# Patient Record
Sex: Female | Born: 1980
Health system: Southern US, Community
[De-identification: ages and names within clinical notes are randomized; demographics above are authoritative.]

## PROBLEM LIST (undated history)

## (undated) DIAGNOSIS — K648 Other hemorrhoids: Secondary | ICD-10-CM

## (undated) DIAGNOSIS — D62 Acute posthemorrhagic anemia: Secondary | ICD-10-CM

## (undated) DIAGNOSIS — Z8619 Personal history of other infectious and parasitic diseases: Secondary | ICD-10-CM

## (undated) DIAGNOSIS — J0100 Acute maxillary sinusitis, unspecified: Secondary | ICD-10-CM

## (undated) HISTORY — DX: Personal history of other infectious and parasitic diseases: Z86.19

## (undated) HISTORY — DX: Other hemorrhoids: K64.8

## (undated) HISTORY — DX: Acute maxillary sinusitis, unspecified: J01.00

---

## 2002-06-21 HISTORY — PX: WISDOM TOOTH EXTRACTION: SHX21

## 2004-07-28 ENCOUNTER — Ambulatory Visit: Payer: Self-pay | Admitting: Family Medicine

## 2004-08-18 ENCOUNTER — Ambulatory Visit: Payer: Self-pay | Admitting: Internal Medicine

## 2004-09-30 ENCOUNTER — Ambulatory Visit: Payer: Self-pay | Admitting: Family Medicine

## 2004-10-22 ENCOUNTER — Ambulatory Visit: Payer: Self-pay | Admitting: Family Medicine

## 2005-03-22 ENCOUNTER — Encounter: Payer: Self-pay | Admitting: Family Medicine

## 2005-03-22 ENCOUNTER — Other Ambulatory Visit: Admission: RE | Admit: 2005-03-22 | Discharge: 2005-03-22 | Payer: Self-pay | Admitting: Family Medicine

## 2005-03-22 ENCOUNTER — Ambulatory Visit: Payer: Self-pay | Admitting: Family Medicine

## 2005-05-31 ENCOUNTER — Ambulatory Visit: Payer: Self-pay | Admitting: Family Medicine

## 2005-12-13 ENCOUNTER — Ambulatory Visit: Payer: Self-pay | Admitting: Family Medicine

## 2006-12-01 ENCOUNTER — Ambulatory Visit: Payer: Self-pay | Admitting: Internal Medicine

## 2006-12-01 DIAGNOSIS — K120 Recurrent oral aphthae: Secondary | ICD-10-CM | POA: Insufficient documentation

## 2006-12-01 LAB — CONVERTED CEMR LAB: Rapid Strep: NEGATIVE

## 2006-12-19 ENCOUNTER — Ambulatory Visit: Payer: Self-pay | Admitting: Internal Medicine

## 2006-12-19 ENCOUNTER — Encounter: Payer: Self-pay | Admitting: Family Medicine

## 2006-12-19 LAB — CONVERTED CEMR LAB
Nitrite: NEGATIVE
Specific Gravity, Urine: 1.01
WBC Urine, dipstick: NEGATIVE

## 2007-01-28 ENCOUNTER — Encounter: Payer: Self-pay | Admitting: Family Medicine

## 2007-01-28 ENCOUNTER — Ambulatory Visit: Payer: Self-pay | Admitting: Family Medicine

## 2007-05-08 ENCOUNTER — Encounter: Payer: Self-pay | Admitting: Family Medicine

## 2007-05-08 ENCOUNTER — Other Ambulatory Visit: Admission: RE | Admit: 2007-05-08 | Discharge: 2007-05-08 | Payer: Self-pay | Admitting: Family Medicine

## 2007-05-08 ENCOUNTER — Ambulatory Visit: Payer: Self-pay | Admitting: Family Medicine

## 2007-05-08 DIAGNOSIS — B009 Herpesviral infection, unspecified: Secondary | ICD-10-CM | POA: Insufficient documentation

## 2007-05-10 ENCOUNTER — Encounter (INDEPENDENT_AMBULATORY_CARE_PROVIDER_SITE_OTHER): Payer: Self-pay | Admitting: *Deleted

## 2007-05-11 ENCOUNTER — Telehealth (INDEPENDENT_AMBULATORY_CARE_PROVIDER_SITE_OTHER): Payer: Self-pay | Admitting: *Deleted

## 2007-05-12 ENCOUNTER — Ambulatory Visit: Payer: Self-pay | Admitting: Family Medicine

## 2007-05-12 ENCOUNTER — Encounter (INDEPENDENT_AMBULATORY_CARE_PROVIDER_SITE_OTHER): Payer: Self-pay | Admitting: *Deleted

## 2007-05-12 DIAGNOSIS — R1013 Epigastric pain: Secondary | ICD-10-CM | POA: Insufficient documentation

## 2007-05-17 ENCOUNTER — Ambulatory Visit: Payer: Self-pay | Admitting: Family Medicine

## 2007-05-24 LAB — CONVERTED CEMR LAB
ALT: 21 units/L (ref 0–35)
AST: 22 units/L (ref 0–37)
Basophils Relative: 0.2 % (ref 0.0–1.0)
Bilirubin, Direct: 0.1 mg/dL (ref 0.0–0.3)
CO2: 30 meq/L (ref 19–32)
Calcium: 9.4 mg/dL (ref 8.4–10.5)
Creatinine, Ser: 0.7 mg/dL (ref 0.4–1.2)
Eosinophils Relative: 1 % (ref 0.0–5.0)
GFR calc Af Amer: 130 mL/min
Glucose, Bld: 85 mg/dL (ref 70–99)
Hemoglobin: 14.1 g/dL (ref 12.0–15.0)
Lymphocytes Relative: 43.6 % (ref 12.0–46.0)
Neutro Abs: 2.4 10*3/uL (ref 1.4–7.7)
Platelets: 280 10*3/uL (ref 150–400)
RDW: 11.1 % — ABNORMAL LOW (ref 11.5–14.6)
Total Bilirubin: 1.4 mg/dL — ABNORMAL HIGH (ref 0.3–1.2)
Total Protein: 6.6 g/dL (ref 6.0–8.3)
Triglycerides: 38 mg/dL (ref 0–149)
VLDL: 8 mg/dL (ref 0–40)
WBC: 5.3 10*3/uL (ref 4.5–10.5)

## 2007-07-28 ENCOUNTER — Ambulatory Visit: Payer: Self-pay | Admitting: Family Medicine

## 2007-07-28 DIAGNOSIS — J069 Acute upper respiratory infection, unspecified: Secondary | ICD-10-CM | POA: Insufficient documentation

## 2007-10-23 ENCOUNTER — Telehealth (INDEPENDENT_AMBULATORY_CARE_PROVIDER_SITE_OTHER): Payer: Self-pay | Admitting: *Deleted

## 2008-01-29 ENCOUNTER — Ambulatory Visit: Payer: Self-pay | Admitting: Family Medicine

## 2008-01-29 ENCOUNTER — Encounter (INDEPENDENT_AMBULATORY_CARE_PROVIDER_SITE_OTHER): Payer: Self-pay | Admitting: *Deleted

## 2008-01-29 DIAGNOSIS — Z8719 Personal history of other diseases of the digestive system: Secondary | ICD-10-CM | POA: Insufficient documentation

## 2008-01-29 DIAGNOSIS — M533 Sacrococcygeal disorders, not elsewhere classified: Secondary | ICD-10-CM | POA: Insufficient documentation

## 2008-01-29 LAB — CONVERTED CEMR LAB
Basophils Absolute: 0 10*3/uL (ref 0.0–0.1)
Eosinophils Absolute: 0.1 10*3/uL (ref 0.0–0.7)
Lymphocytes Relative: 33.4 % (ref 12.0–46.0)
MCHC: 34.5 g/dL (ref 30.0–36.0)
MCV: 93.4 fL (ref 78.0–100.0)
Neutrophils Relative %: 56.2 % (ref 43.0–77.0)
Platelets: 295 10*3/uL (ref 150–400)
RBC: 4.19 M/uL (ref 3.87–5.11)
RDW: 11.5 % (ref 11.5–14.6)

## 2008-01-30 ENCOUNTER — Telehealth (INDEPENDENT_AMBULATORY_CARE_PROVIDER_SITE_OTHER): Payer: Self-pay | Admitting: *Deleted

## 2008-02-28 ENCOUNTER — Ambulatory Visit: Payer: Self-pay | Admitting: Gastroenterology

## 2008-02-28 DIAGNOSIS — K625 Hemorrhage of anus and rectum: Secondary | ICD-10-CM | POA: Insufficient documentation

## 2008-03-07 ENCOUNTER — Telehealth: Payer: Self-pay | Admitting: Gastroenterology

## 2008-04-12 ENCOUNTER — Telehealth: Payer: Self-pay | Admitting: Family Medicine

## 2008-04-12 ENCOUNTER — Ambulatory Visit: Payer: Self-pay | Admitting: Family Medicine

## 2008-04-12 DIAGNOSIS — N912 Amenorrhea, unspecified: Secondary | ICD-10-CM | POA: Insufficient documentation

## 2008-04-12 LAB — CONVERTED CEMR LAB
Beta hcg, urine, semiquantitative: NEGATIVE
hCG, Beta Chain, Quant, S: <0.5 milliintl units/mL

## 2008-04-16 ENCOUNTER — Telehealth: Payer: Self-pay | Admitting: Family Medicine

## 2008-08-07 ENCOUNTER — Encounter: Payer: Self-pay | Admitting: Family Medicine

## 2008-08-07 ENCOUNTER — Ambulatory Visit: Payer: Self-pay | Admitting: Family Medicine

## 2008-08-07 ENCOUNTER — Other Ambulatory Visit: Admission: RE | Admit: 2008-08-07 | Discharge: 2008-08-07 | Payer: Self-pay | Admitting: Family Medicine

## 2008-08-20 ENCOUNTER — Encounter (INDEPENDENT_AMBULATORY_CARE_PROVIDER_SITE_OTHER): Payer: Self-pay | Admitting: *Deleted

## 2008-09-06 ENCOUNTER — Ambulatory Visit: Payer: Self-pay | Admitting: Family Medicine

## 2008-09-06 DIAGNOSIS — J0100 Acute maxillary sinusitis, unspecified: Secondary | ICD-10-CM | POA: Insufficient documentation

## 2008-09-06 DIAGNOSIS — J309 Allergic rhinitis, unspecified: Secondary | ICD-10-CM | POA: Insufficient documentation

## 2009-02-11 ENCOUNTER — Telehealth (INDEPENDENT_AMBULATORY_CARE_PROVIDER_SITE_OTHER): Payer: Self-pay | Admitting: *Deleted

## 2009-02-11 ENCOUNTER — Ambulatory Visit: Payer: Self-pay | Admitting: Family Medicine

## 2009-02-11 DIAGNOSIS — B359 Dermatophytosis, unspecified: Secondary | ICD-10-CM | POA: Insufficient documentation

## 2009-02-13 ENCOUNTER — Encounter: Payer: Self-pay | Admitting: Family Medicine

## 2009-02-14 ENCOUNTER — Encounter: Payer: Self-pay | Admitting: Family Medicine

## 2009-04-04 ENCOUNTER — Ambulatory Visit: Payer: Self-pay | Admitting: Gastroenterology

## 2009-04-04 ENCOUNTER — Telehealth: Payer: Self-pay | Admitting: Gastroenterology

## 2009-04-04 ENCOUNTER — Encounter (INDEPENDENT_AMBULATORY_CARE_PROVIDER_SITE_OTHER): Payer: Self-pay | Admitting: *Deleted

## 2009-04-07 ENCOUNTER — Encounter: Payer: Self-pay | Admitting: Gastroenterology

## 2009-04-07 ENCOUNTER — Ambulatory Visit: Payer: Self-pay | Admitting: Gastroenterology

## 2009-04-09 ENCOUNTER — Telehealth: Payer: Self-pay | Admitting: Gastroenterology

## 2009-04-14 ENCOUNTER — Telehealth: Payer: Self-pay | Admitting: Gastroenterology

## 2009-07-31 ENCOUNTER — Emergency Department (HOSPITAL_COMMUNITY): Admission: EM | Admit: 2009-07-31 | Discharge: 2009-08-01 | Payer: Self-pay | Admitting: Emergency Medicine

## 2009-10-22 ENCOUNTER — Ambulatory Visit: Payer: Self-pay | Admitting: Family Medicine

## 2009-10-22 DIAGNOSIS — N946 Dysmenorrhea, unspecified: Secondary | ICD-10-CM | POA: Insufficient documentation

## 2010-03-09 ENCOUNTER — Telehealth: Payer: Self-pay | Admitting: Family Medicine

## 2010-03-16 ENCOUNTER — Encounter: Admission: RE | Admit: 2010-03-16 | Discharge: 2010-03-16 | Payer: Self-pay | Admitting: Family Medicine

## 2010-03-16 ENCOUNTER — Ambulatory Visit: Payer: Self-pay | Admitting: Family Medicine

## 2010-03-16 ENCOUNTER — Other Ambulatory Visit: Admission: RE | Admit: 2010-03-16 | Discharge: 2010-03-16 | Payer: Self-pay | Admitting: Family Medicine

## 2010-03-16 DIAGNOSIS — N63 Unspecified lump in unspecified breast: Secondary | ICD-10-CM | POA: Insufficient documentation

## 2010-03-19 LAB — CONVERTED CEMR LAB: Pap Smear: NEGATIVE

## 2010-05-21 ENCOUNTER — Ambulatory Visit: Payer: Self-pay | Admitting: Family Medicine

## 2010-05-27 ENCOUNTER — Telehealth (INDEPENDENT_AMBULATORY_CARE_PROVIDER_SITE_OTHER): Payer: Self-pay | Admitting: *Deleted

## 2010-05-27 LAB — CONVERTED CEMR LAB: hCG, Beta Chain, Quant, S: <0.5 milliintl units/mL

## 2010-07-22 NOTE — Assessment & Plan Note (Signed)
Summary: mens cramps/cbs   Vital Signs:  Patient profile:   30 year old female Weight:      126 pounds Pulse rate:   60 / minute BP sitting:   90 / 60  (left arm)  Vitals Entered By: Doristine Devoid (Oct 22, 2009 3:36 PM) CC: menstrual cramps x2 days    History of Present Illness: 30 yo woman here today for menstrual cramps.  pt reports this is not new.  last night they were more severe than usual.  used to take Naproxen w/ good relief but never got relief.  minimal relief w/ ibuprofen.  also took Midol but this made pt drowsy.  today is day 2 of pt's cycle.  not using birth control, not interested at this time.  Allergies (verified): 1)  ! Pcn 2)  ! Hydrocodone  Review of Systems      See HPI  Physical Exam  General:  Well-developed,well-nourished,in no acute distress; alert,appropriate and cooperative throughout examination Abdomen:  soft, NT/ND, +BS, no rebound or guarding   Impression & Recommendations:  Problem # 1:  DYSMENORRHEA (ICD-625.3) Assessment New pt w/ hx of menstrual cramps.  would again like script for Naproxen.  reviewed that if periods are worsening or changing she needs to report this- pt feels they are stable.  script given.  Complete Medication List: 1)  Analpram-hc 1-2.5 % Crea (Hydrocortisone ace-pramoxine) .... Use rectally 2-3 times daily x 2 weeks, then as needed 2)  Naproxen 500 Mg Tabs (Naproxen) .Marland Kitchen.. 1 tab by mouth two times a day as needed for pain.  take w/ food.  Patient Instructions: 1)  Take the Naproxen as needed- take w/ food 2)  Drink plenty of fluids to help cut down on the cramping 3)  You can add tylenol but don't take any extra ibuprofen/motrin/aleve/ etc (they're all in the same family) 4)  Hang in there! Prescriptions: NAPROXEN 500 MG TABS (NAPROXEN) 1 tab by mouth two times a day as needed for pain.  take w/ food.  #60 x 3   Entered and Authorized by:   Neena Rhymes MD   Signed by:   Neena Rhymes MD on 10/22/2009    Method used:   Electronically to        [facility]. [phone]* (retail)       [address]       [place], [place]  [address]       Ph: [phone]       Fax: [phone]   RxID:   [id number]

## 2010-07-22 NOTE — Progress Notes (Signed)
Summary: cramps  Phone Note Call from Patient Call back at [phone]   Summary of Call: Patient left message on triage that she is having cramps and the Naproxen she was given is not helping. Patient has taken it 1x (only allowed to take it twice) and feels that is insufficient. Tried to call patient to discuss and was unable to reach patient/leave message. Initial call taken by: Lucious Groves CMA,  March 09, 2010 4:20 PM  Follow-up for Phone Call        I was able to reach patient and she states that she is feeling better today, but would like to know what should she do on days like yesterday when that Naproxen is not enough for the pain? Please advise. Follow-up by: Lucious Groves CMA,  March 10, 2010 11:38 AM  Additional Follow-up for Phone Call Additional follow up Details #1::        try ultram 0--sent to pharmacy if that doesn't work she should come in to discuss bcp Additional Follow-up by: Loreen Freud DO,  March 10, 2010 11:57 AM    Additional Follow-up for Phone Call Additional follow up Details #2::    discuss with patient..............Marland KitchenFelecia Deloach CMA  March 10, 2010 5:08 PM   New/Updated Medications: ULTRAM 50 MG TABS (TRAMADOL HCL) 1-2 by mouth q6h as needed pain Prescriptions: ULTRAM 50 MG TABS (TRAMADOL HCL) 1-2 by mouth q6h as needed pain  #30 x 1   Entered and Authorized by:   Loreen Freud DO   Signed by:   Loreen Freud DO on 03/10/2010   Method used:   Electronically to        [facility]. [phone]* (retail)       [address]       [place], [place]  [address]       Ph: [phone]       Fax: [phone]   RxID:   [id number]

## 2010-07-22 NOTE — Progress Notes (Signed)
Summary: Results 12/7  Phone Note Outgoing Call   Call placed by: Almeta Monas CMA Duncan Dull),  May 27, 2010 10:30 AM Call placed to: Patient Details for Reason: Results Summary of Call: spk with pt and she is aware that pregnancy test was neg, says her cycle started. Copy mailed.... Almeta Monas CMA Duncan Dull)  May 27, 2010 10:32 AM  Initial call taken by: Almeta Monas CMA Duncan Dull),  May 27, 2010 10:32 AM

## 2010-07-22 NOTE — Assessment & Plan Note (Signed)
Summary: pap - lump -breast/cbs   Vital Signs:  Patient profile:   30 year old female Height:      64 inches Weight:      126.2 pounds BMI:     21.74 Temp:     98.4 degrees F oral Pulse rate:   60 / minute Pulse rhythm:   regular BP sitting:   100 / 60  (right arm) Cuff size:   regular  Vitals Entered By: Almeta Monas CMA Duncan Dull) (March 16, 2010 2:06 PM) CC: c/o left breast lump and pap needed   History of Present Illness: Pt is here for pap only but c/o mass felt in L breast for about 1 week.  No other complaints.   Current Medications (verified): 1)  None  Allergies (verified): 1)  ! Pcn 2)  ! 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 Family History Diabetes 1st degree relative: maternal grandmother breast cancer: paternal grandmother No FH of Colon Cancer:  Social History: Reviewed history from 05/08/2007 and no changes required. Occupation: Designer, television/film set Single Never Smoked Alcohol use-yes Drug use-no Regular exercise-yes  Review of Systems      See HPI  Physical Exam  General:  Well-developed,well-nourished,in no acute distress; alert,appropriate and cooperative throughout examination Breasts:  L outer low quad+  mass palpated, moveable Abdomen:  Bowel sounds positive,abdomen soft and non-tender without masses, organomegaly or hernias noted. Genitalia:  Pelvic Exam:        External: normal female genitalia without lesions or masses        Vagina: normal without lesions or masses        Cervix: normal without lesions or masses        Adnexa: normal bimanual exam without masses or fullness        Uterus: normal by palpation        Pap smear: performed Extremities:  No clubbing, cyanosis, edema, or deformity noted with normal full range of motion of all joints.   Skin:  Intact without suspicious lesions or rashes Psych:  Cognition and judgment appear intact. Alert and cooperative with normal attention span and concentration. No apparent delusions, illusions, hallucinations   Impression & Recommendations:  Problem # 1:  ROUTINE GYNECOLOGICAL EXAMINATION (ICD-V72.31) pap done pt was not fasting  will do labs later  Problem # 2:  BREAST MASS, LEFT (ICD-611.72)  Orders: Radiology Referral (Radiology)  Mammogram was ordered today.

## 2010-07-22 NOTE — Assessment & Plan Note (Signed)
Summary: FOR A PREG TEST//PH   Vital Signs:  Patient profile:   30 year old female Menstrual status:  regular LMP:     04/06/2010 Weight:      126.6 pounds Temp:     97.9 degrees F oral BP sitting:   112 / 68  (right arm) Cuff size:   regular  Vitals Entered By: Almeta Monas CMA Duncan Dull) (May 21, 2010 1:22 PM) CC: irreg cycle--wants pregnancy test LMP (date): 04/06/2010     Menstrual Status regular Enter LMP: 04/06/2010 Last PAP Result NEGATIVE FOR INTRAEPITHELIAL LESIONS OR MALIGNANCY.   History of Present Illness: Pt here c/o abd cramping and negative pregnancy test at home.  No other symptoms.     Current Medications (verified): 1)  None  Allergies (verified): 1)  ! Pcn 2)  ! Hydrocodone  Past History:  Past Medical History: Last updated: 05/08/2007 Unremarkable  Past Surgical History: Last updated: 05/08/2007 Denies surgical history  Family History: Last updated: 04/04/2009 Family History Diabetes 1st degree relative: maternal grandmother breast cancer: paternal grandmother No FH of Colon Cancer:  Social History: Last updated: 05/21/2010 Occupation: laid off Single Never Smoked Alcohol use-yes Drug use-no Regular exercise-yes  Risk Factors: Alcohol Use: <1 (05/08/2007) Caffeine Use: 3 (05/08/2007) Exercise: yes (05/08/2007)  Risk Factors: Smoking Status: never (05/08/2007) Passive Smoke Exposure: no (05/08/2007)  Family History: Reviewed history from 04/04/2009 and no changes required. Family History Diabetes 1st degree relative: maternal grandmother breast cancer: paternal grandmother No FH of Colon Cancer:  Social History: Reviewed history from 05/08/2007 and no changes required. Occupation: laid off Single Never Smoked Alcohol use-yes Drug use-no Regular exercise-yes  Review of Systems      See HPI  Physical Exam  General:  Well-developed,well-nourished,in no acute distress; alert,appropriate and cooperative throughout  examination Psych:  Oriented X3 and normally interactive.     Impression & Recommendations:  Problem # 1:  AMENORRHEA (ICD-626.0)  Orders: Venipuncture (04540) TLB-Preg Serum Quant (B-hCG) (84702-HCG-QN) Specimen Handling (98119)   Orders Added: 1)  Venipuncture [14782] 2)  TLB-Preg Serum Quant (B-hCG) [84702-HCG-QN] 3)  Specimen Handling [99000] 4)  Est. Patient Level II [95621]

## 2010-11-09 ENCOUNTER — Ambulatory Visit: Payer: Self-pay | Admitting: Family Medicine

## 2011-01-05 ENCOUNTER — Ambulatory Visit (INDEPENDENT_AMBULATORY_CARE_PROVIDER_SITE_OTHER): Payer: BC Managed Care – PPO | Admitting: Internal Medicine

## 2011-01-05 ENCOUNTER — Encounter: Payer: Self-pay | Admitting: Internal Medicine

## 2011-01-05 VITALS — BP 100/68 | HR 78 | Temp 98.2°F | Wt 122.6 lb

## 2011-01-05 DIAGNOSIS — J029 Acute pharyngitis, unspecified: Secondary | ICD-10-CM

## 2011-01-05 DIAGNOSIS — J3489 Other specified disorders of nose and nasal sinuses: Secondary | ICD-10-CM

## 2011-01-05 DIAGNOSIS — R0981 Nasal congestion: Secondary | ICD-10-CM

## 2011-01-05 MED ORDER — MOMETASONE FUROATE 50 MCG/ACT NA SUSP: 2.0000 | Freq: Every day | NASAL | Status: DC

## 2011-01-05 NOTE — Progress Notes (Signed)
  Subjective:    Patient ID: Mary Good, female    DOB: November 27, 1980, 30 y.o.   MRN: [id number]  HPI Possible Respiratory tract infection Onset/symptoms: 2 weeks ago she was treated with Z-Pak for possible sinusitis. This was manifested by fever, facial pain, and frontal headaches. ST recurred 2 days ago Exposures (illness/environmental/extrinsic):no Progression of symptoms:now major issue = ST, loss of appetite Treatments/response:Zpack, Tylenol Present symptoms: Fever/chills/sweats:no Frontal headache:no Facial pain: only @ night Nasal purulence:no Dental pain:no Lymphadenopathy:yes Wheezing/shortness of breath:no Cough/sputum:no Associated extrinsic/allergic symptoms:itchy eyes/ sneezing:no Past medical history: Seasonal allergies/asthma:no Smoking history:only in college  Nine weeks pregnant;? allergic to PCN           Review of Systems     Objective:   Physical Exam  Thin , in  good health and nourishment; no acute distress or increased work of breathing is present.  No  lymphadenopathy about the head, neck, or axilla noted.   Eyes: No conjunctival inflammation or lid edema is present. There is no scleral icterus.  Ears:  External ear exam shows no significant lesions or deformities.  Otoscopic examination reveals clear canals, tympanic membranes are intact bilaterally without bulging, retraction, inflammation or discharge.  Nose:  External nasal examination shows no deformity or inflammation. Nasal mucosa are pink and moist without lesions or exudates. No septal dislocation or dislocation.No obstruction to airflow.   Oral exam: Dental hygiene is good; lips and gums are healthy appearing.There is no oropharyngeal erythema or exudate noted.   Neck:  No deformities, thyromegaly, masses, or tenderness noted.   Supple with full range of motion without pain.   Heart:  Normal rate and regular rhythm. S1 and S2 normal without gallop, murmur, click, rub or other  extra sounds.   Lungs:Chest clear to auscultation; no wheezes, rhonchi,rales ,or rubs present.No increased work of breathing.    Extremities:  No cyanosis, edema, or clubbing  noted    Skin: Warm & dry w/o jaundice or tenting.          Assessment & Plan:  #1 sore throat;The Centor criteria for pharyngitis which include fever, pharyngeal exudate, tender cervical  lymphadenopathy , and absence of cough were assessed. By these criteria and the criteria for rhinosinusitis neither are present at this time. Beta strep is negative  Plan: See patient instructions

## 2011-01-05 NOTE — Patient Instructions (Signed)
Plain Mucinex for thick secretions ;force NON dairy fluids for next 48 hrs. Use a Neti pot daily as needed for sinus congestion with DISTILLED water. Zicam Melts or Zinc lozenges ; vitamin C 2000 mg daily; & Echinacea for 4-7 days. Report fever, exudate("pus") or progressive pain.  Nasonex sample as discussed

## 2011-03-17 LAB — ABO/RH: RH Type: POSITIVE

## 2011-03-17 LAB — HEPATITIS B SURFACE ANTIGEN: Hepatitis B Surface Ag: NEGATIVE

## 2011-03-17 LAB — RPR: RPR: NONREACTIVE

## 2011-07-15 LAB — STREP B DNA PROBE: GBS: POSITIVE

## 2011-08-13 ENCOUNTER — Other Ambulatory Visit: Payer: Self-pay | Admitting: Obstetrics and Gynecology

## 2011-08-13 ENCOUNTER — Telehealth (HOSPITAL_COMMUNITY): Payer: Self-pay | Admitting: *Deleted

## 2011-08-13 ENCOUNTER — Encounter (HOSPITAL_COMMUNITY): Payer: Self-pay | Admitting: *Deleted

## 2011-08-13 NOTE — Telephone Encounter (Signed)
Preadmission screen  

## 2011-08-15 ENCOUNTER — Inpatient Hospital Stay (HOSPITAL_COMMUNITY)
Admission: RE | Admit: 2011-08-15 | Discharge: 2011-08-19 | DRG: 765 | Disposition: A | Discharge status: Home / Self Care | Payer: 59 | Source: Ambulatory Visit | Attending: Obstetrics and Gynecology | Admitting: Obstetrics and Gynecology

## 2011-08-15 ENCOUNTER — Encounter (HOSPITAL_COMMUNITY): Payer: Self-pay

## 2011-08-15 DIAGNOSIS — Z22330 Carrier of Group B streptococcus: Secondary | ICD-10-CM

## 2011-08-15 DIAGNOSIS — O480 Post-term pregnancy: Secondary | ICD-10-CM | POA: Diagnosis present

## 2011-08-15 DIAGNOSIS — O324XX0 Maternal care for high head at term, not applicable or unspecified: Secondary | ICD-10-CM | POA: Diagnosis present

## 2011-08-15 DIAGNOSIS — O9903 Anemia complicating the puerperium: Secondary | ICD-10-CM | POA: Diagnosis not present

## 2011-08-15 DIAGNOSIS — O339 Maternal care for disproportion, unspecified: Secondary | ICD-10-CM | POA: Diagnosis present

## 2011-08-15 DIAGNOSIS — D62 Acute posthemorrhagic anemia: Secondary | ICD-10-CM

## 2011-08-15 DIAGNOSIS — O99892 Other specified diseases and conditions complicating childbirth: Secondary | ICD-10-CM | POA: Diagnosis present

## 2011-08-15 HISTORY — DX: Acute posthemorrhagic anemia: D62

## 2011-08-15 LAB — CBC
MCH: 31.9 pg (ref 26.0–34.0)
MCV: 93.8 fL (ref 78.0–100.0)
Platelets: 320 10*3/uL (ref 150–400)
RDW: 12.7 % (ref 11.5–15.5)

## 2011-08-15 MED ORDER — VANCOMYCIN HCL IN DEXTROSE 1-5 GM/200ML-% IV SOLN
1000.0000 mg | Freq: Once | INTRAVENOUS | Status: AC
  Administered 2011-08-16: 1000 mg via INTRAVENOUS
  Filled 2011-08-15 (×2): qty 200

## 2011-08-15 MED ORDER — ONDANSETRON HCL 4 MG/2ML IJ SOLN: 4.0000 mg | Freq: Four times a day (QID) | INTRAMUSCULAR | Status: DC | PRN

## 2011-08-15 MED ORDER — FLEET ENEMA 7-19 GM/118ML RE ENEM: 1.0000 | ENEMA | RECTAL | Status: DC | PRN

## 2011-08-15 MED ORDER — ACETAMINOPHEN 325 MG PO TABS: 650.0000 mg | ORAL_TABLET | ORAL | Status: DC | PRN

## 2011-08-15 MED ORDER — OXYTOCIN BOLUS FROM INFUSION
500.0000 mL | Freq: Once | INTRAVENOUS | Status: DC
  Filled 2011-08-15: qty 500

## 2011-08-15 MED ORDER — CITRIC ACID-SODIUM CITRATE 334-500 MG/5ML PO SOLN
30.0000 mL | ORAL | Status: DC | PRN
  Administered 2011-08-16: 30 mL via ORAL
  Filled 2011-08-15: qty 15

## 2011-08-15 MED ORDER — VANCOMYCIN HCL IN DEXTROSE 1-5 GM/200ML-% IV SOLN: 1000.0000 mg | Freq: Once | INTRAVENOUS | Status: DC

## 2011-08-15 MED ORDER — ZOLPIDEM TARTRATE 10 MG PO TABS
10.0000 mg | ORAL_TABLET | Freq: Every evening | ORAL | Status: DC | PRN
  Administered 2011-08-15: 10 mg via ORAL
  Filled 2011-08-15: qty 1

## 2011-08-15 MED ORDER — OXYTOCIN 20 UNITS IN LACTATED RINGERS INFUSION - SIMPLE: 125.0000 mL/h | Freq: Once | INTRAVENOUS | Status: DC

## 2011-08-15 MED ORDER — IBUPROFEN 600 MG PO TABS: 600.0000 mg | ORAL_TABLET | Freq: Four times a day (QID) | ORAL | Status: DC | PRN

## 2011-08-15 MED ORDER — LACTATED RINGERS IV SOLN
INTRAVENOUS | Status: DC
  Administered 2011-08-16: 125 mL/h via INTRAVENOUS

## 2011-08-15 MED ORDER — OXYTOCIN 20 UNITS IN LACTATED RINGERS INFUSION - SIMPLE
1.0000 m[IU]/min | INTRAVENOUS | Status: DC
  Administered 2011-08-16: 1 m[IU]/min via INTRAVENOUS
  Filled 2011-08-15: qty 1000

## 2011-08-15 MED ORDER — LIDOCAINE HCL (PF) 1 % IJ SOLN
30.0000 mL | INTRAMUSCULAR | Status: DC | PRN
  Filled 2011-08-15: qty 30

## 2011-08-15 MED ORDER — DINOPROSTONE 10 MG VA INST
10.0000 mg | VAGINAL_INSERT | Freq: Once | VAGINAL | Status: AC
  Administered 2011-08-15: 10 mg via VAGINAL
  Filled 2011-08-15: qty 1

## 2011-08-15 MED ORDER — TERBUTALINE SULFATE 1 MG/ML IJ SOLN: 0.2500 mg | Freq: Once | INTRAMUSCULAR | Status: AC | PRN

## 2011-08-15 MED ORDER — LACTATED RINGERS IV SOLN: 500.0000 mL | INTRAVENOUS | Status: DC | PRN

## 2011-08-15 NOTE — Progress Notes (Signed)
Mary Good is a 31 y.o. G1P0 at [redacted]w[redacted]d by LMP admitted for induction of labor due to Post dates. Due date 2/18.  Subjective: Feels anxious Good FM  Objective: LMP 10/26/2010      FHT:  FHR: 145 bpm, variability: moderate,  accelerations:  Present,  decelerations:  Absent UC:   none SVE:    closed / 60/-1  Cervidil pending  Labs: pending  Assessment / Plan: Postdates Induction GBS positive  Labor: Progressing normally, Cervidil pending Preeclampsia:  na Fetal Wellbeing:  Category I Pain Control:  Labor support without medications I/D:  n/a Anticipated MOD:  NSVD Start Vancomycin with active labor  Mac Dowdell J 08/15/2011, 8:11 PM

## 2011-08-16 ENCOUNTER — Encounter (HOSPITAL_COMMUNITY): Payer: Self-pay | Admitting: Anesthesiology

## 2011-08-16 ENCOUNTER — Encounter (HOSPITAL_COMMUNITY)
Admission: RE | Disposition: A | Discharge status: Home / Self Care | Payer: Self-pay | Source: Ambulatory Visit | Attending: Obstetrics and Gynecology

## 2011-08-16 ENCOUNTER — Inpatient Hospital Stay (HOSPITAL_COMMUNITY): Payer: 59 | Admitting: Anesthesiology

## 2011-08-16 ENCOUNTER — Encounter (HOSPITAL_COMMUNITY): Payer: Self-pay

## 2011-08-16 DIAGNOSIS — O480 Post-term pregnancy: Secondary | ICD-10-CM | POA: Diagnosis present

## 2011-08-16 LAB — RPR: RPR Ser Ql: NONREACTIVE

## 2011-08-16 SURGERY — Surgical Case
Anesthesia: Epidural | Site: Abdomen | Wound class: Clean Contaminated

## 2011-08-16 MED ORDER — SCOPOLAMINE 1 MG/3DAYS TD PT72: 1.0000 | MEDICATED_PATCH | Freq: Once | TRANSDERMAL | Status: DC

## 2011-08-16 MED ORDER — IBUPROFEN 600 MG PO TABS: 600.0000 mg | ORAL_TABLET | Freq: Four times a day (QID) | ORAL | Status: DC | PRN

## 2011-08-16 MED ORDER — LANOLIN HYDROUS EX OINT: 1.0000 "application " | TOPICAL_OINTMENT | CUTANEOUS | Status: DC | PRN

## 2011-08-16 MED ORDER — ONDANSETRON HCL 4 MG/2ML IJ SOLN
INTRAMUSCULAR | Status: DC | PRN
  Administered 2011-08-16: 4 mg via INTRAVENOUS

## 2011-08-16 MED ORDER — NALOXONE HCL 0.4 MG/ML IJ SOLN: 0.4000 mg | INTRAMUSCULAR | Status: DC | PRN

## 2011-08-16 MED ORDER — KETOROLAC TROMETHAMINE 30 MG/ML IJ SOLN: 30.0000 mg | Freq: Four times a day (QID) | INTRAMUSCULAR | Status: DC | PRN

## 2011-08-16 MED ORDER — MENTHOL 3 MG MT LOZG: 1.0000 | LOZENGE | OROMUCOSAL | Status: DC | PRN

## 2011-08-16 MED ORDER — MEPERIDINE HCL 25 MG/ML IJ SOLN: 6.2500 mg | INTRAMUSCULAR | Status: DC | PRN

## 2011-08-16 MED ORDER — FENTANYL 2.5 MCG/ML BUPIVACAINE 1/10 % EPIDURAL INFUSION (WH - ANES)
14.0000 mL/h | INTRAMUSCULAR | Status: DC
  Administered 2011-08-16 (×2): 14 mL/h via EPIDURAL
  Filled 2011-08-16 (×3): qty 60

## 2011-08-16 MED ORDER — EPHEDRINE 5 MG/ML INJ
10.0000 mg | INTRAVENOUS | Status: DC | PRN
  Filled 2011-08-16: qty 4

## 2011-08-16 MED ORDER — OXYTOCIN 10 UNIT/ML IJ SOLN
INTRAMUSCULAR | Status: AC
  Filled 2011-08-16: qty 2

## 2011-08-16 MED ORDER — BUTORPHANOL TARTRATE 2 MG/ML IJ SOLN
1.0000 mg | INTRAMUSCULAR | Status: DC | PRN
  Administered 2011-08-16: 1 mg via INTRAVENOUS
  Filled 2011-08-16: qty 1

## 2011-08-16 MED ORDER — ONDANSETRON HCL 4 MG/2ML IJ SOLN
INTRAMUSCULAR | Status: AC
  Filled 2011-08-16: qty 2

## 2011-08-16 MED ORDER — SENNOSIDES-DOCUSATE SODIUM 8.6-50 MG PO TABS
2.0000 | ORAL_TABLET | Freq: Every day | ORAL | Status: DC
  Administered 2011-08-16 – 2011-08-18 (×3): 2 via ORAL

## 2011-08-16 MED ORDER — KETOROLAC TROMETHAMINE 60 MG/2ML IM SOLN
60.0000 mg | Freq: Once | INTRAMUSCULAR | Status: AC | PRN
  Administered 2011-08-16: 60 mg via INTRAMUSCULAR

## 2011-08-16 MED ORDER — DIPHENHYDRAMINE HCL 50 MG/ML IJ SOLN: 12.5000 mg | INTRAMUSCULAR | Status: DC | PRN

## 2011-08-16 MED ORDER — FENTANYL 2.5 MCG/ML BUPIVACAINE 1/10 % EPIDURAL INFUSION (WH - ANES)
INTRAMUSCULAR | Status: DC | PRN
  Administered 2011-08-16: 4 mL/h via EPIDURAL

## 2011-08-16 MED ORDER — DIPHENHYDRAMINE HCL 50 MG/ML IJ SOLN: 25.0000 mg | INTRAMUSCULAR | Status: DC | PRN

## 2011-08-16 MED ORDER — LIDOCAINE-EPINEPHRINE (PF) 2 %-1:200000 IJ SOLN
INTRAMUSCULAR | Status: AC
  Filled 2011-08-16: qty 20

## 2011-08-16 MED ORDER — WITCH HAZEL-GLYCERIN EX PADS: 1.0000 "application " | MEDICATED_PAD | CUTANEOUS | Status: DC | PRN

## 2011-08-16 MED ORDER — FENTANYL CITRATE 0.05 MG/ML IJ SOLN
INTRAMUSCULAR | Status: DC | PRN
  Administered 2011-08-16 (×2): 50 ug via INTRAVENOUS

## 2011-08-16 MED ORDER — FENTANYL CITRATE 0.05 MG/ML IJ SOLN: 25.0000 ug | INTRAMUSCULAR | Status: DC | PRN

## 2011-08-16 MED ORDER — IBUPROFEN 600 MG PO TABS
600.0000 mg | ORAL_TABLET | Freq: Four times a day (QID) | ORAL | Status: DC
  Administered 2011-08-17 – 2011-08-19 (×9): 600 mg via ORAL
  Filled 2011-08-16 (×10): qty 1

## 2011-08-16 MED ORDER — EPHEDRINE 5 MG/ML INJ: 10.0000 mg | INTRAVENOUS | Status: DC | PRN

## 2011-08-16 MED ORDER — NALBUPHINE HCL 10 MG/ML IJ SOLN: 5.0000 mg | INTRAMUSCULAR | Status: DC | PRN

## 2011-08-16 MED ORDER — KETOROLAC TROMETHAMINE 60 MG/2ML IM SOLN: 60.0000 mg | Freq: Once | INTRAMUSCULAR | Status: DC | PRN

## 2011-08-16 MED ORDER — MIDAZOLAM HCL 2 MG/2ML IJ SOLN
INTRAMUSCULAR | Status: AC
  Filled 2011-08-16: qty 2

## 2011-08-16 MED ORDER — TETANUS-DIPHTH-ACELL PERTUSSIS 5-2.5-18.5 LF-MCG/0.5 IM SUSP
0.5000 mL | Freq: Once | INTRAMUSCULAR | Status: AC
  Administered 2011-08-18: 0.5 mL via INTRAMUSCULAR
  Filled 2011-08-16 (×2): qty 0.5

## 2011-08-16 MED ORDER — OXYTOCIN 20 UNITS IN LACTATED RINGERS INFUSION - SIMPLE
INTRAVENOUS | Status: DC | PRN
  Administered 2011-08-16: 40 [IU] via INTRAVENOUS
  Administered 2011-08-16: 20 [IU] via INTRAVENOUS

## 2011-08-16 MED ORDER — LACTATED RINGERS IV SOLN: 500.0000 mL | Freq: Once | INTRAVENOUS | Status: DC

## 2011-08-16 MED ORDER — SODIUM CHLORIDE 0.9 % IV SOLN: 1.0000 ug/kg/h | INTRAVENOUS | Status: DC | PRN

## 2011-08-16 MED ORDER — MORPHINE SULFATE (PF) 0.5 MG/ML IJ SOLN
INTRAMUSCULAR | Status: DC | PRN
  Administered 2011-08-16: 4 mg via EPIDURAL
  Administered 2011-08-16: 1 mg via INTRAVENOUS

## 2011-08-16 MED ORDER — SIMETHICONE 80 MG PO CHEW: 80.0000 mg | CHEWABLE_TABLET | ORAL | Status: DC | PRN

## 2011-08-16 MED ORDER — PHENYLEPHRINE 40 MCG/ML (10ML) SYRINGE FOR IV PUSH (FOR BLOOD PRESSURE SUPPORT)
PREFILLED_SYRINGE | INTRAVENOUS | Status: AC
  Filled 2011-08-16: qty 5

## 2011-08-16 MED ORDER — METHYLERGONOVINE MALEATE 0.2 MG/ML IJ SOLN: 0.2000 mg | INTRAMUSCULAR | Status: DC | PRN

## 2011-08-16 MED ORDER — PROMETHAZINE HCL 25 MG/ML IJ SOLN
12.5000 mg | Freq: Four times a day (QID) | INTRAMUSCULAR | Status: DC | PRN
  Administered 2011-08-16: 12.5 mg via INTRAVENOUS
  Filled 2011-08-16: qty 1

## 2011-08-16 MED ORDER — SODIUM CHLORIDE 0.9 % IJ SOLN: 3.0000 mL | INTRAMUSCULAR | Status: DC | PRN

## 2011-08-16 MED ORDER — LACTATED RINGERS IV SOLN
INTRAVENOUS | Status: DC
  Administered 2011-08-17: 01:00:00 via INTRAVENOUS

## 2011-08-16 MED ORDER — PRENATAL MULTIVITAMIN CH
1.0000 | ORAL_TABLET | Freq: Every day | ORAL | Status: DC
  Administered 2011-08-17 – 2011-08-19 (×3): 1 via ORAL
  Filled 2011-08-16 (×3): qty 1

## 2011-08-16 MED ORDER — PHENYLEPHRINE HCL 10 MG/ML IJ SOLN
INTRAMUSCULAR | Status: DC | PRN
  Administered 2011-08-16: 80 ug via INTRAVENOUS

## 2011-08-16 MED ORDER — DIPHENHYDRAMINE HCL 25 MG PO CAPS
25.0000 mg | ORAL_CAPSULE | ORAL | Status: DC | PRN
  Filled 2011-08-16: qty 1

## 2011-08-16 MED ORDER — FENTANYL CITRATE 0.05 MG/ML IJ SOLN
INTRAMUSCULAR | Status: AC
  Filled 2011-08-16: qty 2

## 2011-08-16 MED ORDER — PHENYLEPHRINE 40 MCG/ML (10ML) SYRINGE FOR IV PUSH (FOR BLOOD PRESSURE SUPPORT): 80.0000 ug | PREFILLED_SYRINGE | INTRAVENOUS | Status: DC | PRN

## 2011-08-16 MED ORDER — ONDANSETRON HCL 4 MG/2ML IJ SOLN: 4.0000 mg | INTRAMUSCULAR | Status: DC | PRN

## 2011-08-16 MED ORDER — DIPHENHYDRAMINE HCL 25 MG PO CAPS: 25.0000 mg | ORAL_CAPSULE | ORAL | Status: DC | PRN

## 2011-08-16 MED ORDER — BUPIVACAINE HCL (PF) 0.25 % IJ SOLN
INTRAMUSCULAR | Status: AC
  Filled 2011-08-16: qty 30

## 2011-08-16 MED ORDER — KETOROLAC TROMETHAMINE 30 MG/ML IJ SOLN: 30.0000 mg | Freq: Four times a day (QID) | INTRAMUSCULAR | Status: AC | PRN

## 2011-08-16 MED ORDER — MORPHINE SULFATE 0.5 MG/ML IJ SOLN
INTRAMUSCULAR | Status: AC
  Filled 2011-08-16: qty 10

## 2011-08-16 MED ORDER — DIBUCAINE 1 % RE OINT: 1.0000 "application " | TOPICAL_OINTMENT | RECTAL | Status: DC | PRN

## 2011-08-16 MED ORDER — LACTATED RINGERS IV SOLN
INTRAVENOUS | Status: DC | PRN
  Administered 2011-08-16 (×3): via INTRAVENOUS

## 2011-08-16 MED ORDER — KETOROLAC TROMETHAMINE 60 MG/2ML IM SOLN
INTRAMUSCULAR | Status: AC
  Filled 2011-08-16: qty 2

## 2011-08-16 MED ORDER — OXYCODONE-ACETAMINOPHEN 5-325 MG PO TABS
1.0000 | ORAL_TABLET | ORAL | Status: DC | PRN
  Administered 2011-08-17 (×2): 1 via ORAL
  Administered 2011-08-18: 2 via ORAL
  Administered 2011-08-18: 1 via ORAL
  Administered 2011-08-18: 2 via ORAL
  Administered 2011-08-18 (×2): 1 via ORAL
  Administered 2011-08-18: 2 via ORAL
  Administered 2011-08-19: 1 via ORAL
  Administered 2011-08-19: 2 via ORAL
  Filled 2011-08-16: qty 1
  Filled 2011-08-16: qty 2
  Filled 2011-08-16 (×3): qty 1
  Filled 2011-08-16: qty 2
  Filled 2011-08-16 (×2): qty 1
  Filled 2011-08-16: qty 2
  Filled 2011-08-16: qty 1
  Filled 2011-08-16: qty 2

## 2011-08-16 MED ORDER — BUPIVACAINE HCL (PF) 0.25 % IJ SOLN
INTRAMUSCULAR | Status: DC | PRN
  Administered 2011-08-16: 30 mL

## 2011-08-16 MED ORDER — SIMETHICONE 80 MG PO CHEW
80.0000 mg | CHEWABLE_TABLET | Freq: Three times a day (TID) | ORAL | Status: DC
  Administered 2011-08-16 – 2011-08-19 (×7): 80 mg via ORAL

## 2011-08-16 MED ORDER — SODIUM BICARBONATE 8.4 % IV SOLN
INTRAVENOUS | Status: DC | PRN
  Administered 2011-08-16: 4 mL via EPIDURAL

## 2011-08-16 MED ORDER — METHYLERGONOVINE MALEATE 0.2 MG PO TABS: 0.2000 mg | ORAL_TABLET | ORAL | Status: DC | PRN

## 2011-08-16 MED ORDER — OXYTOCIN 20 UNITS IN LACTATED RINGERS INFUSION - SIMPLE: 125.0000 mL/h | INTRAVENOUS | Status: AC

## 2011-08-16 MED ORDER — METOCLOPRAMIDE HCL 5 MG/ML IJ SOLN: 10.0000 mg | Freq: Three times a day (TID) | INTRAMUSCULAR | Status: DC | PRN

## 2011-08-16 MED ORDER — SODIUM BICARBONATE 8.4 % IV SOLN
INTRAVENOUS | Status: AC
  Filled 2011-08-16: qty 50

## 2011-08-16 MED ORDER — ONDANSETRON HCL 4 MG/2ML IJ SOLN: 4.0000 mg | Freq: Three times a day (TID) | INTRAMUSCULAR | Status: DC | PRN

## 2011-08-16 MED ORDER — DIPHENHYDRAMINE HCL 25 MG PO CAPS: 25.0000 mg | ORAL_CAPSULE | Freq: Four times a day (QID) | ORAL | Status: DC | PRN

## 2011-08-16 MED ORDER — ZOLPIDEM TARTRATE 5 MG PO TABS: 5.0000 mg | ORAL_TABLET | Freq: Every evening | ORAL | Status: DC | PRN

## 2011-08-16 MED ORDER — ONDANSETRON HCL 4 MG PO TABS: 4.0000 mg | ORAL_TABLET | ORAL | Status: DC | PRN

## 2011-08-16 MED ORDER — PHENYLEPHRINE 40 MCG/ML (10ML) SYRINGE FOR IV PUSH (FOR BLOOD PRESSURE SUPPORT)
80.0000 ug | PREFILLED_SYRINGE | INTRAVENOUS | Status: DC | PRN
  Filled 2011-08-16: qty 5

## 2011-08-16 MED ORDER — NALOXONE HCL 0.4 MG/ML IJ SOLN: 1.0000 ug/kg/h | INTRAMUSCULAR | Status: DC | PRN

## 2011-08-16 SURGICAL SUPPLY — 30 items
CLOTH BEACON ORANGE TIMEOUT ST (SAFETY) ×2 IMPLANT
CONTAINER PREFILL 10% NBF 15ML (MISCELLANEOUS) IMPLANT
DRESSING TELFA 8X3 (GAUZE/BANDAGES/DRESSINGS) IMPLANT
DRSG COVADERM 4X6 (GAUZE/BANDAGES/DRESSINGS) ×1 IMPLANT
ELECT REM PT RETURN 9FT ADLT (ELECTROSURGICAL) ×2
ELECTRODE REM PT RTRN 9FT ADLT (ELECTROSURGICAL) ×1 IMPLANT
EXTRACTOR VACUUM M CUP 4 TUBE (SUCTIONS) IMPLANT
GAUZE SPONGE 4X4 12PLY STRL LF (GAUZE/BANDAGES/DRESSINGS) ×4 IMPLANT
GLOVE BIO SURGEON STRL SZ7.5 (GLOVE) ×4 IMPLANT
GOWN PREVENTION PLUS LG XLONG (DISPOSABLE) ×4 IMPLANT
GOWN PREVENTION PLUS XLARGE (GOWN DISPOSABLE) ×2 IMPLANT
KIT ABG SYR 3ML LUER SLIP (SYRINGE) IMPLANT
NDL HYPO 25X5/8 SAFETYGLIDE (NEEDLE) IMPLANT
NEEDLE HYPO 25X1 1.5 SAFETY (NEEDLE) ×2 IMPLANT
NEEDLE HYPO 25X5/8 SAFETYGLIDE (NEEDLE) IMPLANT
NS IRRIG 1000ML POUR BTL (IV SOLUTION) ×2 IMPLANT
PACK C SECTION WH (CUSTOM PROCEDURE TRAY) ×2 IMPLANT
PAD ABD 7.5X8 STRL (GAUZE/BANDAGES/DRESSINGS) IMPLANT
SLEEVE SCD COMPRESS KNEE MED (MISCELLANEOUS) IMPLANT
STAPLER VISISTAT 35W (STAPLE) ×2 IMPLANT
SUT MNCRL 0 VIOLET CTX 36 (SUTURE) ×2 IMPLANT
SUT MON AB 2-0 CT1 27 (SUTURE) ×2 IMPLANT
SUT MON AB-0 CT1 36 (SUTURE) ×4 IMPLANT
SUT MONOCRYL 0 CTX 36 (SUTURE) ×2
SUT PLAIN 0 NONE (SUTURE) IMPLANT
SUT PLAIN 2 0 XLH (SUTURE) IMPLANT
SYR CONTROL 10ML LL (SYRINGE) ×2 IMPLANT
TOWEL OR 17X24 6PK STRL BLUE (TOWEL DISPOSABLE) ×4 IMPLANT
TRAY FOLEY CATH 14FR (SET/KITS/TRAYS/PACK) ×2 IMPLANT
WATER STERILE IRR 1000ML POUR (IV SOLUTION) ×2 IMPLANT

## 2011-08-16 NOTE — H&P (Signed)
NAME:  Mary Good, Mary Good       ACCOUNT NO.:  [id number]  MEDICAL RECORD NO.:  [id number]  LOCATION:                                 FACILITY:  PHYSICIAN:  Lenoard Aden, M.D.DATE OF BIRTH:  21-Sep-1980  DATE OF ADMISSION:  08/15/2011 DATE OF DISCHARGE:                             HISTORY & PHYSICAL   CHIEF COMPLAINT:  Postdates induction.  HISTORY OF PRESENT ILLNESS:  She is a 31 year old white female, G1, P0, at 40-6/7th weeks gestation who presents for cervical ripening and induction.  She has allergies to PENICILLIN.  She is a nonsmoker and nondrinker.  She denies domestic or physical violence.  PRENATAL COURSE:  Complicated by GBS positive.  She has a noncontributory surgical history.  She has a noncontributory social history.  Her family history IS remarkable for breast cancer, nicotine dependence, heart disease, diabetes, and kidney stones.  This is her first pregnancy.  PHYSICAL EXAMINATION:  GENERAL:  She is a well-developed, well- nourished, white female, in no acute distress. HEENT:  Normal. NECK:  Supple.  Full range of motion. LUNGS:  Clear. HEART:  Regular rhythm. ABDOMEN:  Soft, gravid, nontender.  Estimated fetal weight 7-1/2 to 8 pounds.  Cervix is closed, 50%, vertex, -1. EXTREMITIES:  There are no cords. NEUROLOGIC:  Nonfocal. SKIN:  Intact.  IMPRESSION:  Postdates pregnancy for cervical ripening induction.  PLAN:  Cervidil now, reactive NST noted, Pitocin in the a.m.  Start vancomycin with active labor.     Lenoard Aden, M.D.     RJT/MEDQ  D:  08/15/2011  T:  08/15/2011  Job:  [id number]

## 2011-08-16 NOTE — Anesthesia Procedure Notes (Signed)

## 2011-08-16 NOTE — Progress Notes (Signed)
Pt states she wants to wait to see baby so she can sleep at present

## 2011-08-16 NOTE — Anesthesia Postprocedure Evaluation (Signed)
  Anesthesia Post-op Note  Patient: Mary Good  Procedure(s) Performed: Procedure(s) (LRB): CESAREAN SECTION (N/A)  Patient is awake, responsive, moving her legs, and has signs of resolution of her numbness. Pain and nausea are reasonably well controlled. Vital signs are stable and clinically acceptable. Oxygen saturation is clinically acceptable. There are no apparent anesthetic complications at this time. Patient is ready for discharge.

## 2011-08-16 NOTE — Progress Notes (Signed)
Mary Good is a 31 y.o. G1P0 at [redacted]w[redacted]d by LMP admitted for induction of labor due to Post dates. Due date 2/18.  Subjective: Comfortable   Objective: BP 123/61  Pulse 107  Temp(Src) 97.8 F (36.6 C) (Oral)  Resp 20  Ht 5\' 3"  (1.6 m)  Wt 72.576 kg (160 lb)  BMI 28.34 kg/m2  LMP 10/26/2010      FHT:  FHR: 155 bpm, variability: moderate,  accelerations:  Present,  decelerations:  Absent UC:   regular, every 2-3 minutes SVE:   6/90/0 AROM- clear IUPC placed  Labs: Lab Results  Component Value Date   WBC 12.6* 08/15/2011   HGB 13.4 08/15/2011   HCT 39.4 08/15/2011   MCV 93.8 08/15/2011   PLT 320 08/15/2011    Assessment / Plan: Induction of labor due to postterm,  progressing well on pitocin  Labor: Progressing normally Preeclampsia:  na Fetal Wellbeing:  Category I Pain Control:  Epidural I/D:  n/a Anticipated MOD:  NSVD  [name] J 08/16/2011, 8:26 AM

## 2011-08-16 NOTE — Progress Notes (Signed)
Mary Good is a 31 y.o. G1P0 at [redacted]w[redacted]d by LMP admitted for induction of labor due to Post dates. Due date 2/18.  Subjective: uncomfortable  Objective: BP 123/61  Pulse 107  Temp(Src) 98 F (36.7 C) (Oral)  Resp 16  Ht 5\' 3"  (1.6 m)  Wt 72.576 kg (160 lb)  BMI 28.34 kg/m2  LMP 10/26/2010      FHT:  FHR: 155 bpm, variability: moderate,  accelerations:  Present,  decelerations:  Absent UC:   irregular, every 3-5 minutes SVE:   Dilation: 2.5 Effacement (%): 80 Station: -2 Exam by:: [employer], RN   Labs: Lab Results  Component Value Date   WBC 12.6* 08/15/2011   HGB 13.4 08/15/2011   HCT 39.4 08/15/2011   MCV 93.8 08/15/2011   PLT 320 08/15/2011    Assessment / Plan: Induction of labor due to postterm,  progressing well on pitocin now after cervidil  Labor: Progressing on Pitocin, will continue to increase then AROM Preeclampsia:  na Fetal Wellbeing:  Category I Pain Control:  Epidural I/D:  n/a Anticipated MOD:  NSVD  [name] 08/16/2011, 7:04 AM

## 2011-08-16 NOTE — Progress Notes (Signed)
Mary Good is a 31 y.o. G1P0000 at [redacted]w[redacted]d by LMP admitted for induction of labor due to Post dates. Due date 2/18.  Subjective: Exhausted Pushing x 3 hours.   Objective: BP 97/68  Pulse 115  Temp(Src) 98.4 F (36.9 C) (Axillary)  Resp 20  Ht 5\' 3"  (1.6 m)  Wt 72.576 kg (160 lb)  BMI 28.34 kg/m2  LMP 10/26/2010      FHT:  FHR: 155 bpm, variability: minimal ,  accelerations:  Abscent,  decelerations:  Absent UC:   regular, every 2 minutes SVE:   10/100/+1-+2  Labs: Lab Results  Component Value Date   WBC 12.6* 08/15/2011   HGB 13.4 08/15/2011   HCT 39.4 08/15/2011   MCV 93.8 08/15/2011   PLT 320 08/15/2011    Assessment / Plan: Arrest of decent Non Reassuring FHR  Labor: failed descent with pushing x 3 hours Preeclampsia:  na Fetal Wellbeing:  Category II Pain Control:  Epidural I/D:  n/a Anticipated MOD:  Proceed with Cesarean section. Risks vs benefits of surgery discussed.  Kay Shippy J 08/16/2011, 4:46 PM

## 2011-08-16 NOTE — Consult Note (Signed)
Requested to attend primary C/S at 41+ weeks gestation for arrested descent. Additional pertinent history of mother being GBBS positive but having been pretreated with antibiotic.    At birth infant in vertex and was manually extracted with some tone but no immediate cry.  Placed under radiant warmer and given vigorous tactile stimulation and bulb suction of naso/oropharynx yielding tenacious clear mucus.  By five minutes of age infant had registered several cries and was improving in central color ( of lips/conjunctivae).  Apgar scores 8/9 at 1/5 minutes respectively. No dysmorphic features.  Has voided x 1.  Shown to parents and allowed to remain in OR under care of L/D RN and assigned pediatrician.    Dagoberto Ligas MD [facility]

## 2011-08-16 NOTE — Transfer of Care (Signed)
Immediate Anesthesia Transfer of Care Note  Patient: Mary Good  Procedure(s) Performed: Procedure(s) (LRB): CESAREAN SECTION (N/A)  Patient Location: PACU  Anesthesia Type: Epidural  Level of Consciousness: awake, alert  and oriented  Airway & Oxygen Therapy: Patient Spontanous Breathing  Post-op Assessment: Report given to PACU RN and Post -op Vital signs reviewed and stable  Post vital signs: Reviewed and stable  Complications: No apparent anesthesia complications

## 2011-08-16 NOTE — Op Note (Signed)
Cesarean Section Procedure Note  Indications: failure to progress: arrest of descent and non-reassuring fetal status  Pre-operative Diagnosis: 41 week 0 day pregnancy.  Post-operative Diagnosis: same  Surgeon: Lenoard Aden   Assistants: none  Anesthesia: Epidural anesthesia and Local anesthesia 0.25.% bupivacaine  ASA Class: 2  Procedure Details  The patient was seen in the Holding Room. The risks, benefits, complications, treatment options, and expected outcomes were discussed with the patient.  The patient concurred with the proposed plan, giving informed consent. The risks of anesthesia, infection, bleeding and possible injury to other organs discussed. Injury to bowel, bladder, or ureter with possible need for repair discussed. Possible need for transfusion with secondary risks of hepatitis or HIV acquisition discussed. Post operative complications to include but not limited to DVT, PE and Pneumonia noted. The site of surgery properly noted/marked. The patient was taken to Operating Room # 1, identified as Mary Good and the procedure verified as C-Section Delivery. A Time Out was held and the above information confirmed.  After induction of anesthesia, the patient was draped and prepped in the usual sterile manner. A Pfannenstiel incision was made and carried down through the subcutaneous tissue to the fascia. Fascial incision was made and extended transversely using Mayo scissors. The fascia was separated from the underlying rectus tissue superiorly and inferiorly. The peritoneum was identified and entered. Peritoneal incision was extended longitudinally. The utero-vesical peritoneal reflection was incised transversely and the bladder flap was bluntly freed from the lower uterine segment. A low transverse uterine incision(Kerr hysterotomy) was made. Delivered from OP presentation was a  female with Apgar scores of 8 at one minute and 9 at five minutes. After the umbilical cord  was clamped and cut cord blood was obtained for evaluation. The placenta was removed intact and appeared normal. The uterine outline, tubes and ovaries appeared normal. The uterine incision was closed with running locked sutures of 0 Monocryl x 2 layers. Hemostasis was observed. Lavage was carried out until clear. The fascia was then reapproximated with running sutures of 0 Monocryl. The skin was reapproximated with staples. Cord ph 7.29 Instrument, sponge, and needle counts were correct prior the abdominal closure and at the conclusion of the case.   Findings: No extensions OP presentation  Estimated Blood Loss:  800         Drains: foley                 Specimens: placenta                 Complications:  None; patient tolerated the procedure well.         Disposition: PACU - hemodynamically stable.         Condition: stable  Attending Attestation: I performed the procedure.

## 2011-08-16 NOTE — Progress Notes (Signed)
Rqst by pt to proceed with epidural before pitocin started. Per MD, proceed with Pitocin now & MD will place epidural orders.

## 2011-08-16 NOTE — Anesthesia Preprocedure Evaluation (Signed)

## 2011-08-17 ENCOUNTER — Encounter (HOSPITAL_COMMUNITY): Payer: Self-pay

## 2011-08-17 DIAGNOSIS — D62 Acute posthemorrhagic anemia: Secondary | ICD-10-CM

## 2011-08-17 HISTORY — DX: Acute posthemorrhagic anemia: D62

## 2011-08-17 LAB — CBC
MCV: 94.7 fL (ref 78.0–100.0)
Platelets: 204 10*3/uL (ref 150–400)
RBC: 3.01 MIL/uL — ABNORMAL LOW (ref 3.87–5.11)
WBC: 18.2 10*3/uL — ABNORMAL HIGH (ref 4.0–10.5)

## 2011-08-17 NOTE — Anesthesia Postprocedure Evaluation (Signed)
  Anesthesia Post-op Note  Patient: Mary Good  Procedure(s) Performed: Procedure(s) (LRB): CESAREAN SECTION (N/A)  Patient Location: 132  Anesthesia Type: Epidural  Level of Consciousness: awake, alert  and oriented  Airway and Oxygen Therapy: Patient Spontanous Breathing  Post-op Pain: moderate  Post-op Assessment: Post-op Vital signs reviewed, Patient's Cardiovascular Status Stable, Pain level not controlled, No headache, No backache, No residual numbness and No residual motor weakness  Post-op Vital Signs: Reviewed and stable  Complications: No apparent anesthesia complications

## 2011-08-17 NOTE — Addendum Note (Signed)
Addendum  created 08/17/11 0746 by Karleen Dolphin, CRNA   Modules edited:Notes Section

## 2011-08-17 NOTE — Progress Notes (Addendum)
Subjective: POD# 1 Information for the patient's newborn:  Nyazia, Canevari [[id number]]  female   Reports feeling well, up ad lib. Feeding: breast, no concerns. Patient reports tolerating PO.  Breast symptoms: none Pain controlled with Motrin and Percocet Denies HA/SOB/C/P/N/V/dizziness. Flatus absent. She reports vaginal bleeding as normal, without clots.  She is ambulating, no void yet, cath out x 4 hours.     Objective:   VS:  Filed Vitals:   08/17/11 0130 08/17/11 0330 08/17/11 0530 08/17/11 0935  BP: 105/61 96/47 96/53  106/68  Pulse: 125 117 110 120  Temp: 99.2 F (37.3 C) 99.1 F (37.3 C) 98.8 F (37.1 C) 97.7 F (36.5 C)  TempSrc: Oral Oral Oral Oral  Resp: 18 18 18 16   Height:      Weight:      SpO2: 93% 95% 95% 98%     Intake/Output Summary (Last 24 hours) at 08/17/11 1052 Last data filed at 08/17/11 0640  Gross per 24 hour  Intake   1300 ml  Output   1500 ml  Net   -200 ml        Basename 08/17/11 0538 08/15/11 2015  WBC 18.2* 12.6*  HGB 9.6* 13.4  HCT 28.5* 39.4  PLT 204 320     Blood type: A/Positive/-- (09/26 0000)  Rubella: Immune (09/26 0000)     Physical Exam:  General: alert, cooperative and no distress CV: Regular rate and rhythm Resp: clear Abdomen: soft, nontender, normal bowel sounds Incision: clean, dry, intact and dressing to LST Uterine Fundus: firm, below umbilicus, nontender Lochia: minimal Ext: edema trace      Assessment/Plan: 31 y.o.   POD# 1.  s/p Cesarean Delivery.  Indications: arrest of descent                Principal Problem:  *PP care - s/p C/S 2/25 Active Problems:  Acute blood loss anemia Doing well, stable.  Start oral Fe at discharge              Advance diet as tolerated Ambulate attempt void, straight cath x 1 if no void by 6 hrs post cath D/C Routine post-op care  PAUL,DANIELA 08/17/2011, 10:52 AM

## 2011-08-18 ENCOUNTER — Encounter (HOSPITAL_COMMUNITY): Payer: Self-pay | Admitting: Obstetrics and Gynecology

## 2011-08-18 NOTE — Progress Notes (Signed)
Patient ID: Mary Good, female   DOB: 1980-11-24, 31 y.o.   MRN: [id number]  POSTOPERATIVE DAY # 2 S/P cesarean section   S:         Reports feeling well             Tolerating po intake / no nausea / no  vomiting / + flatus / no BM             Bleeding is light             Pain controlled withmotrin and percocet             Up ad lib / ambulatory  Newborn breast feeding     O:  A & O x 3              VS: Blood pressure 106/72, pulse 80, temperature 97.9 F (36.6 C), temperature source Oral, resp. rate 16, height 5\' 3"  (1.6 m), weight 72.576 kg (160 lb), last menstrual period 10/26/2010, SpO2 96.00%, unknown if currently breastfeeding.  Lungs: Clear and unlabored  Heart: regular rate and rhythm / no mumurs  Abdomen: soft, non-tender, non-distended, active bowel sounds             Fundus: firm, non-tender, U-1             Dressing OFF              Incision:  No erythema / no ecchymosis / no drainage  Perineum: no edema  Lochia: light  Extremities: trace edema, no calf pain or tenderness  A:        POD # 2 S/P cesarean section           P:        Routine postoperative care              Anticipate discharge in am - may call this pm for early discharge     Marlinda Mike 08/18/2011, 8:54 AM

## 2011-08-18 NOTE — Progress Notes (Signed)
POSTOPERATIVE DAY # 2 S/P cesarean section   S:         Sleeping with initial rounds this am             Tolerating po intake / no  nausea / no  vomiting / + flatus / no BM             Bleeding is light             Pain controlled with motrin and percocet             Up ad lib / ambulatory  Newborn breast feeding  / female newborn   O:  A & O x 3              VS: Blood pressure 106/72, pulse 80, temperature 97.9 F (36.6 C), temperature source Oral, resp. rate 16, height 5\' 3"  (1.6 m), weight 72.576 kg (160 lb), last menstrual period 10/26/2010, SpO2 96.00%, unknown if currently breastfeeding.  Lungs: Clear and unlabored  Heart: regular rate and rhythm / no mumurs  Abdomen: soft, non-tender, non-distended, active BS             Fundus: firm, non-tender, U-1             Dressing OFF             Incision:  approximated with staples / no erythema / no ecchymosis /  No drainage  Perineum: no edema  Lochia: light  Extremities: no edema, no calf pain or tenderness  A:        POD # 2 S/P cesarean section            Mild acute blood loss anemia  P:        Routine postoperative care              Anticipate discharge tomorrow     Marlinda Mike 08/18/2011, 9:10 AM

## 2011-08-19 MED ORDER — OXYCODONE-ACETAMINOPHEN 5-325 MG PO TABS: 1.0000 | ORAL_TABLET | ORAL | Status: AC | PRN

## 2011-08-19 MED ORDER — FERRALET 90 90-1 MG PO TABS: 1.0000 | ORAL_TABLET | Freq: Every day | ORAL | Status: DC

## 2011-08-19 MED ORDER — IBUPROFEN 600 MG PO TABS: 600.0000 mg | ORAL_TABLET | Freq: Four times a day (QID) | ORAL | Status: AC

## 2011-08-19 MED ORDER — DOCUSATE SODIUM 100 MG PO CAPS: 100.0000 mg | ORAL_CAPSULE | Freq: Every day | ORAL | Status: AC

## 2011-08-19 NOTE — Progress Notes (Signed)
Subjective: POD# 3 Information for the patient's newborn:  Mary Good, Mary Good [[id number]]  female  Reports feeling well. Feeding: breast Patient reports tolerating PO.  Breast symptoms: milk is in, breasts hard / swollen. Pain controlled with Motrin and Percocet. Denies HA/SOB/C/P/N/V/dizziness. Flatus present. She reports vaginal bleeding as normal, without clots.  She is ambulating, urinating without difficult.     Objective:   VS:  Filed Vitals:   08/18/11 0601 08/18/11 1512 08/18/11 2106 08/19/11 0626  BP: 106/72 105/72 114/74 107/74  Pulse: 80 86 114 104  Temp: 97.9 F (36.6 C) 97.2 F (36.2 C) 98 F (36.7 C) 98.6 F (37 C)  TempSrc: Oral Oral Oral Oral  Resp: 16 16 18 18   Height:      Weight:      SpO2: 96%       No intake or output data in the 24 hours ending 08/19/11 0918      Basename 08/17/11 0538  WBC 18.2*  HGB 9.6*  HCT 28.5*  PLT 204     Blood type: A/Positive/-- (09/26 0000)  Rubella: Immune (09/26 0000)     Physical Exam:  General: alert, cooperative and no distress CV: Regular rate and rhythm Resp: clear Abdomen: soft, nontender, normal bowel sounds Incision: clean, dry, intact and staples in place Uterine Fundus: firm, below umbilicus, nontender Lochia: minimal Ext: edema +1 pedal and pretibial      Assessment/Plan: 31 y.o.   POD# 3.  s/p Cesarean Delivery.  Indications: cephalopelvic disproportion and failure to progress                Active Problems:  Postpartum care - s/p cesarean delivery (2/25)  Acute blood loss anemia  Asymptomatic, will start oral Fe supplement and colace stool softener at home Doing well, stable.    Routine post-op care D/C home with instructions.   Mary Good 08/19/2011, 9:18 AM

## 2011-08-19 NOTE — Discharge Summary (Signed)
POSTOPERATIVE DISCHARGE SUMMARY:  Patient ID: Mary Good MRN: [id number] DOB/AGE: 18-Jan-1981 31 y.o.  Admit date: 08/15/2011 Discharge date:  08/19/2011   Admission Diagnoses:  1. Post dated pregnancy for induction of labor  Discharge Diagnoses:  1. Term Pregnancy-delivered 2. Status post primary cesarean section for arrest of descent and non-reassuring fetal status.  Prenatal history: G1P1001   EDC : 08/09/2011, by Other Basis  Prenatal care at [facility] since 17 weeks, transfer of care with good prior prenatal care.  Prenatal course complicated by GBS positive status.  Prenatal Labs: ABO, Rh: A (09/26 0000)  Antibody: Negative (09/26 0000) Rubella: Immune (09/26 0000)  RPR: NON REACTIVE (02/24 2015)  HBsAg: Negative (09/26 0000)  HIV: Non-reactive (09/26 0000)  GBS: Positive (01/24 0000)  1 hr Glucola : 127   Medical / Surgical History :  Past medical history:  Past Medical History  Diagnosis Date  . Acute maxillary sinusitis   . History of chicken pox   . PP care - s/p C/S 2/25 08/17/2011  . Acute blood loss anemia 08/17/2011    Past surgical history:  Past Surgical History  Procedure Date  . Wisdom tooth extraction 2004  . Cesarean section 08/16/2011    Procedure: CESAREAN SECTION;  Surgeon: Lenoard Aden, MD;  Location: [facility];  Service: Gynecology;  Laterality: N/A;    Family History:  Family History  Problem Relation Age of Onset  . Nephrolithiasis Father   . Heart attack Father   . Diabetes Maternal Grandmother   . Cancer Paternal Grandmother     breast    Social History:  reports that she quit smoking about 9 years ago. She has never used smokeless tobacco. She reports that she does not drink alcohol or use illicit drugs.   Allergies: Penicillins and Hydrocodone (itching)   Current Medications at time of admission:  Prescriptions prior to admission  Medication Sig Dispense Refill  . Prenatal Vit-Fe Fumarate-FA  (PRENATAL MULTIVITAMIN) TABS Take 1 tablet by mouth daily.        Admit Labs: WBC 12.6 / hgb 13.4 / hct 39.4 / plts 320  Intrapartum Course:  Patient admitted to labor floor for overnight ripening of cervix with Cervidil. She was started on Pitocin intravenous in th e morning and received antibiotics per protocol for GBS prophylaxis. She received an epidural for pain control. Patient progresses well to complete dilation and actively pushed for several hours but no progress made past +1/+2 station. Fetal heart tracing on monitor showed  Minimal variability and no accelerations. Patient was counseled for cesarean section due to arrest of descent and non-reassuring fetal status.   Procedures: Cesarean section delivery of female newborn by Dr Billy Coast.  See operative report for further details  Postoperative / postpartum course: Mild acute blood loss anemia noted, patient did not exhibit symptoms. Patient was started on oral iron supplement at discharge.  Physical Exam:  VSS: Blood pressure 107/74, pulse 104, temperature 98.6 F (37 C), temperature source Oral, resp. rate 18, height 5\' 3"  (1.6 m), weight 72.576 kg (160 lb), last menstrual period 10/26/2010, SpO2 96.00%, unknown if currently breastfeeding.   LABS:  Basename 08/17/11 0538  WBC 18.2*  HGB 9.6*  HCT 28.5*  PLT 204     Incision:  approximated with staples / no erythema / no ecchymosis / no drainage Staples:  removed prior to discharge and replaced with benzoin and steri strips.  Discharge Instructions:  Discharged Condition: good Activity: pelvic rest and  weight lifting precautions x 2 weeks Diet: routine Medications:  Medication List  As of 08/19/2011  9:30 AM   TAKE these medications         docusate sodium 100 MG capsule   Commonly known as: COLACE   Take 1 capsule (100 mg total) by mouth daily.      Ferralet 90 90-1 MG Tabs   Take 1 tablet by mouth daily. Take on empty stomach between meals with cup of orange  juice or Vit. C 250 mg.  Do not take with dairy products or calcium.      ibuprofen 600 MG tablet   Commonly known as: ADVIL,MOTRIN   Take 1 tablet (600 mg total) by mouth every 6 (six) hours.      oxyCODONE-acetaminophen 5-325 MG per tablet   Commonly known as: PERCOCET   Take 1-2 tablets by mouth every 3 (three) hours as needed (moderate - severe pain).      prenatal multivitamin Tabs   Take 1 tablet by mouth daily.           Condition: stable Postpartum Instructions: refer to practice specific booklet Discharge to: home Disposition: Final discharge disposition not confirmed Follow up :  Follow-up Information    Follow up with Lenoard Aden, MD in 6 weeks.   Contact information:   [address] [place] [address] [phone]           Signed: Arlan Organ 08/19/2011, 9:30 AM

## 2011-08-19 NOTE — Discharge Instructions (Signed)

## 2011-08-25 LAB — HM PAP SMEAR

## 2013-04-23 ENCOUNTER — Ambulatory Visit (HOSPITAL_BASED_OUTPATIENT_CLINIC_OR_DEPARTMENT_OTHER): Admission: RE | Admit: 2013-04-23 | Payer: 59 | Source: Ambulatory Visit | Admitting: Otolaryngology

## 2013-04-23 ENCOUNTER — Encounter (HOSPITAL_BASED_OUTPATIENT_CLINIC_OR_DEPARTMENT_OTHER): Admission: RE | Payer: Self-pay | Source: Ambulatory Visit

## 2013-04-23 SURGERY — TONSILLECTOMY
Anesthesia: General

## 2013-06-26 ENCOUNTER — Ambulatory Visit (INDEPENDENT_AMBULATORY_CARE_PROVIDER_SITE_OTHER): Payer: 59 | Admitting: Family Medicine

## 2013-06-26 ENCOUNTER — Encounter: Payer: Self-pay | Admitting: Family Medicine

## 2013-06-26 VITALS — BP 92/60 | HR 139 | Temp 100.1°F | Wt 127.0 lb

## 2013-06-26 DIAGNOSIS — J111 Influenza due to unidentified influenza virus with other respiratory manifestations: Secondary | ICD-10-CM

## 2013-06-26 DIAGNOSIS — J101 Influenza due to other identified influenza virus with other respiratory manifestations: Secondary | ICD-10-CM

## 2013-06-26 DIAGNOSIS — R509 Fever, unspecified: Secondary | ICD-10-CM

## 2013-06-26 DIAGNOSIS — N912 Amenorrhea, unspecified: Secondary | ICD-10-CM

## 2013-06-26 LAB — POCT INFLUENZA A/B
Influenza A, POC: POSITIVE
Influenza B, POC: NEGATIVE

## 2013-06-26 LAB — HCG, QUANTITATIVE, PREGNANCY: HCG, BETA CHAIN, QUANT, S: 0.43 m[IU]/mL

## 2013-06-26 MED ORDER — OSELTAMIVIR PHOSPHATE 75 MG PO CAPS: 75.0000 mg | ORAL_CAPSULE | Freq: Two times a day (BID) | ORAL | Status: DC

## 2013-06-26 MED ORDER — GUAIFENESIN-CODEINE 100-10 MG/5ML PO SYRP: ORAL_SOLUTION | ORAL | Status: DC

## 2013-06-26 NOTE — Progress Notes (Signed)
Pre visit review using our clinic review tool, if applicable. No additional management support is needed unless otherwise documented below in the visit note. 

## 2013-06-26 NOTE — Progress Notes (Signed)
  Subjective:     Mary Good is a 33 y.o. female who presents for evaluation of symptoms of a URI. Symptoms include achiness, congestion, cough described as productive and fever 103. Onset of symptoms was 1 day ago, and has been gradually worsening since that time. Treatment to date: cough suppressants.  The following portions of the patient's history were reviewed and updated as appropriate: allergies, current medications, past family history, past medical history, past social history, past surgical history and problem list.  Review of Systems Pertinent items are noted in HPI.   Objective:    BP 92/60  Pulse 139  Temp(Src) 100.1 F (37.8 C) (Oral)  Wt 127 lb (57.607 kg)  SpO2 96%  LMP 06/06/2013 General appearance: alert, cooperative, appears stated age and mild distress Ears: normal TM's and external ear canals both ears Nose: clear discharge, moderate congestion, turbinates red, swollen, no sinus tenderness Throat: abnormal findings: mild oropharyngeal erythema Neck: no adenopathy, supple, symmetrical, trachea midline and thyroid not enlarged, symmetric, no tenderness/mass/nodules Lungs: clear to auscultation bilaterally Heart: S1, S2 normal   Assessment:    influenza and viral upper respiratory illness   Plan:    preg test stat  tamiflu if neg Cough med  rto prn

## 2013-06-26 NOTE — Patient Instructions (Signed)

## 2013-06-27 NOTE — Progress Notes (Signed)
Spoke with patient and informed that preg test was negative.

## 2013-06-28 ENCOUNTER — Telehealth: Payer: Self-pay | Admitting: *Deleted

## 2013-06-28 NOTE — Telephone Encounter (Signed)
MSG left to call the office      KP 

## 2013-06-28 NOTE — Telephone Encounter (Signed)
Please advise      KP 

## 2013-06-28 NOTE — Telephone Encounter (Signed)
Patient called and stated that she was diagnosed with the flu on 06/26/2012 by dr Laury AxonLowne. Patient now feels like it is turning into pneumonia. She is experiencing sore throat,weak,headaches,fever,and chest hurts. Patient wanted to see what would dr Laury AxonLowne recommend her to do.

## 2013-06-28 NOTE — Telephone Encounter (Signed)
cxr 2 view

## 2013-07-05 NOTE — Telephone Encounter (Signed)
letter mailed      KP

## 2013-09-11 LAB — OB RESULTS CONSOLE ABO/RH: RH TYPE: POSITIVE

## 2013-09-11 LAB — OB RESULTS CONSOLE ANTIBODY SCREEN: Antibody Screen: NEGATIVE

## 2013-09-11 LAB — OB RESULTS CONSOLE RPR: RPR: NONREACTIVE

## 2013-09-11 LAB — OB RESULTS CONSOLE HEPATITIS B SURFACE ANTIGEN: HEP B S AG: NEGATIVE

## 2013-09-11 LAB — OB RESULTS CONSOLE HIV ANTIBODY (ROUTINE TESTING): HIV: NONREACTIVE

## 2013-09-11 LAB — OB RESULTS CONSOLE RUBELLA ANTIBODY, IGM: Rubella: IMMUNE

## 2014-03-11 ENCOUNTER — Other Ambulatory Visit: Payer: Self-pay | Admitting: Obstetrics and Gynecology

## 2014-03-27 ENCOUNTER — Encounter (HOSPITAL_COMMUNITY): Payer: Self-pay | Admitting: Pharmacist

## 2014-04-05 ENCOUNTER — Encounter (HOSPITAL_COMMUNITY): Payer: Self-pay

## 2014-04-05 NOTE — Patient Instructions (Addendum)
   Your procedure is scheduled on:  Wednesday, Oct 21  [facility]  1130 AM Pick up the phone at the desk and dial [phone] and inform us of your arrival.  Please call this number if you have any problems the morning of surgery: [phone]  Remember: Do not eat food after midnight: Tuesday Do not drink clear liquids after: 9 Am Wednesday, day of surgery Take these medicines the morning of surgery with a SIP OF WATER:  None  Do not wear jewelry, make-up, or FINGER nail polish No metal in your hair or on your body. Do not wear lotions, powders, perfumes.  You may wear deodorant.  Do not bring valuables to the hospital. Contacts, dentures or bridgework may not be worn into surgery.  Leave suitcase in the car. After Surgery it may be brought to your room. For patients being admitted to the hospital, checkout time is 11:00am the day of discharge.  Home with husband "Jimbo"  cell [phone].

## 2014-04-08 ENCOUNTER — Encounter (HOSPITAL_COMMUNITY): Payer: Self-pay

## 2014-04-08 ENCOUNTER — Encounter (HOSPITAL_COMMUNITY)
Admission: RE | Admit: 2014-04-08 | Discharge: 2014-04-08 | Disposition: A | Discharge status: Home / Self Care | Payer: 59 | Source: Ambulatory Visit | Attending: Obstetrics and Gynecology | Admitting: Obstetrics and Gynecology

## 2014-04-08 LAB — CBC
HEMATOCRIT: 38.6 % (ref 36.0–46.0)
Hemoglobin: 13.2 g/dL (ref 12.0–15.0)
MCH: 32.3 pg (ref 26.0–34.0)
MCHC: 34.2 g/dL (ref 30.0–36.0)
MCV: 94.4 fL (ref 78.0–100.0)
PLATELETS: 258 10*3/uL (ref 150–400)
RBC: 4.09 MIL/uL (ref 3.87–5.11)
RDW: 12.3 % (ref 11.5–15.5)
WBC: 9.8 10*3/uL (ref 4.0–10.5)

## 2014-04-08 LAB — ABO/RH: ABO/RH(D): A POS

## 2014-04-08 LAB — TYPE AND SCREEN
ABO/RH(D): A POS
ANTIBODY SCREEN: NEGATIVE

## 2014-04-08 LAB — RPR

## 2014-04-09 NOTE — H&P (Signed)
NAME:  Renaldo HarrisonMELVIN, Yanin                ACCOUNT NO.:  [id number]  MEDICAL RECORD NO.:  [id number]  LOCATION:  PERIO                         FACILITY:  [facility]  PHYSICIAN:  Lenoard Adenichard J. Markise Haymer, M.D.DATE OF BIRTH:  [birth year]  DATE OF ADMISSION:  02/07/2014 DATE OF DISCHARGE:                             HISTORY & PHYSICAL   CHIEF COMPLAINT:  Elective repeat cesarean section.  HISTORY OF PRESENT ILLNESS:  The patient is a 33 year old white female, G2, P1, at 5539 and 1/7th weeks gestation who presents for elective repeat cesarean section.  ALLERGIES:  PENICILLIN and HYDROCODONE.  MEDICATIONS:  Prenatal vitamins.  FAMILY HISTORY:  Breast cancer, extensive heart disease, diabetes, kidney stones.  SOCIAL HISTORY:  Nonsmoker, nondrinker.  She denies domestic violence.  PHYSICAL EXAMINATION:  GENERAL:  She is a well-developed, well- nourished, white female, in no acute distress. HEENT:  Normal. NECK:  Supple.  Full range of motion. LUNGS:  Clear. HEART:  Regular rate and rhythm. ABDOMEN:  Soft, gravid, nontender. PELVIC:  Estimated fetal weight 7 pounds.  Cervix closed, 50%, vertex, - 3. EXTREMITIES:  There are no cords. NEUROLOGICAL:  Nonfocal. SKIN:  Intact.  IMPRESSION: 1. A 39-week intrauterine pregnancy. 2. Previous C-section for repeat.  PLAN:  Proceed with elective low segment transverse cesarean section. Risks of anesthesia, infection, bleeding, injury to surrounding organs, and possible need for repair was discussed.  Delayed versus immediate complications to include bowel and bladder injury noted.  The patient acknowledges and wishes to proceed.     Lenoard Adenichard J. Damichael Hofman, M.D.     RJT/MEDQ  D:  04/09/2014  T:  04/09/2014  Job:  [id number]

## 2014-04-10 ENCOUNTER — Inpatient Hospital Stay (HOSPITAL_COMMUNITY): Payer: 59 | Admitting: Anesthesiology

## 2014-04-10 ENCOUNTER — Inpatient Hospital Stay (HOSPITAL_COMMUNITY)
Admission: RE | Admit: 2014-04-10 | Discharge: 2014-04-13 | DRG: 765 | Disposition: A | Discharge status: Home / Self Care | Payer: 59 | Source: Ambulatory Visit | Attending: Obstetrics and Gynecology | Admitting: Obstetrics and Gynecology

## 2014-04-10 ENCOUNTER — Encounter (HOSPITAL_COMMUNITY)
Admission: RE | Disposition: A | Discharge status: Home / Self Care | Payer: Self-pay | Source: Ambulatory Visit | Attending: Obstetrics and Gynecology

## 2014-04-10 ENCOUNTER — Encounter (HOSPITAL_COMMUNITY): Payer: Self-pay | Admitting: Anesthesiology

## 2014-04-10 ENCOUNTER — Encounter (HOSPITAL_COMMUNITY): Payer: 59 | Admitting: Anesthesiology

## 2014-04-10 DIAGNOSIS — O2243 Hemorrhoids in pregnancy, third trimester: Secondary | ICD-10-CM | POA: Diagnosis present

## 2014-04-10 DIAGNOSIS — B951 Streptococcus, group B, as the cause of diseases classified elsewhere: Secondary | ICD-10-CM | POA: Diagnosis present

## 2014-04-10 DIAGNOSIS — Z3A39 39 weeks gestation of pregnancy: Secondary | ICD-10-CM | POA: Diagnosis present

## 2014-04-10 DIAGNOSIS — O3421 Maternal care for scar from previous cesarean delivery: Principal | ICD-10-CM | POA: Diagnosis present

## 2014-04-10 DIAGNOSIS — Z880 Allergy status to penicillin: Secondary | ICD-10-CM

## 2014-04-10 DIAGNOSIS — O99824 Streptococcus B carrier state complicating childbirth: Secondary | ICD-10-CM | POA: Diagnosis present

## 2014-04-10 DIAGNOSIS — Z833 Family history of diabetes mellitus: Secondary | ICD-10-CM | POA: Diagnosis not present

## 2014-04-10 SURGERY — Surgical Case
Anesthesia: Epidural | Site: Abdomen

## 2014-04-10 MED ORDER — BUPIVACAINE IN DEXTROSE 0.75-8.25 % IT SOLN
INTRATHECAL | Status: DC | PRN
  Administered 2014-04-10: 1.4 mL via INTRATHECAL

## 2014-04-10 MED ORDER — NALOXONE HCL 1 MG/ML IJ SOLN
1.0000 ug/kg/h | INTRAVENOUS | Status: DC | PRN
  Filled 2014-04-10: qty 2

## 2014-04-10 MED ORDER — MORPHINE SULFATE (PF) 0.5 MG/ML IJ SOLN
INTRAMUSCULAR | Status: DC | PRN
  Administered 2014-04-10: .2 mg via INTRATHECAL

## 2014-04-10 MED ORDER — TETANUS-DIPHTH-ACELL PERTUSSIS 5-2.5-18.5 LF-MCG/0.5 IM SUSP: 0.5000 mL | Freq: Once | INTRAMUSCULAR | Status: DC

## 2014-04-10 MED ORDER — BUPIVACAINE HCL (PF) 0.25 % IJ SOLN
INTRAMUSCULAR | Status: AC
  Filled 2014-04-10: qty 30

## 2014-04-10 MED ORDER — FENTANYL CITRATE 0.05 MG/ML IJ SOLN
25.0000 ug | INTRAMUSCULAR | Status: DC | PRN
  Administered 2014-04-10: 25 ug via INTRAVENOUS
  Administered 2014-04-10: 50 ug via INTRAVENOUS

## 2014-04-10 MED ORDER — IBUPROFEN 600 MG PO TABS
600.0000 mg | ORAL_TABLET | Freq: Four times a day (QID) | ORAL | Status: DC | PRN
  Administered 2014-04-12: 600 mg via ORAL

## 2014-04-10 MED ORDER — KETOROLAC TROMETHAMINE 30 MG/ML IJ SOLN
30.0000 mg | Freq: Four times a day (QID) | INTRAMUSCULAR | Status: AC | PRN
  Administered 2014-04-10: 30 mg via INTRAVENOUS
  Filled 2014-04-10: qty 1

## 2014-04-10 MED ORDER — LACTATED RINGERS IV SOLN
INTRAVENOUS | Status: DC | PRN
  Administered 2014-04-10 (×3): via INTRAVENOUS

## 2014-04-10 MED ORDER — ONDANSETRON HCL 4 MG/2ML IJ SOLN: 4.0000 mg | Freq: Three times a day (TID) | INTRAMUSCULAR | Status: DC | PRN

## 2014-04-10 MED ORDER — SIMETHICONE 80 MG PO CHEW
80.0000 mg | CHEWABLE_TABLET | ORAL | Status: DC | PRN
  Administered 2014-04-12: 80 mg via ORAL
  Filled 2014-04-10: qty 1

## 2014-04-10 MED ORDER — NALBUPHINE HCL 10 MG/ML IJ SOLN: 5.0000 mg | INTRAMUSCULAR | Status: DC | PRN

## 2014-04-10 MED ORDER — SENNOSIDES-DOCUSATE SODIUM 8.6-50 MG PO TABS
2.0000 | ORAL_TABLET | ORAL | Status: DC
  Administered 2014-04-12 – 2014-04-13 (×2): 2 via ORAL
  Filled 2014-04-10 (×3): qty 2

## 2014-04-10 MED ORDER — PHENYLEPHRINE 8 MG IN D5W 100 ML (0.08MG/ML) PREMIX OPTIME
INJECTION | INTRAVENOUS | Status: DC | PRN
  Administered 2014-04-10: 40 ug/min via INTRAVENOUS

## 2014-04-10 MED ORDER — OXYTOCIN 40 UNITS IN LACTATED RINGERS INFUSION - SIMPLE MED: 62.5000 mL/h | INTRAVENOUS | Status: AC

## 2014-04-10 MED ORDER — FENTANYL CITRATE 0.05 MG/ML IJ SOLN
INTRAMUSCULAR | Status: AC
  Filled 2014-04-10: qty 2

## 2014-04-10 MED ORDER — SCOPOLAMINE 1 MG/3DAYS TD PT72
MEDICATED_PATCH | TRANSDERMAL | Status: AC
  Administered 2014-04-10: 1.5 mg via TRANSDERMAL
  Filled 2014-04-10: qty 1

## 2014-04-10 MED ORDER — SCOPOLAMINE 1 MG/3DAYS TD PT72
1.0000 | MEDICATED_PATCH | Freq: Once | TRANSDERMAL | Status: DC
  Administered 2014-04-10: 1.5 mg via TRANSDERMAL

## 2014-04-10 MED ORDER — MORPHINE SULFATE 0.5 MG/ML IJ SOLN
INTRAMUSCULAR | Status: AC
  Filled 2014-04-10: qty 10

## 2014-04-10 MED ORDER — METHYLERGONOVINE MALEATE 0.2 MG/ML IJ SOLN: 0.2000 mg | INTRAMUSCULAR | Status: DC | PRN

## 2014-04-10 MED ORDER — METHYLERGONOVINE MALEATE 0.2 MG PO TABS: 0.2000 mg | ORAL_TABLET | ORAL | Status: DC | PRN

## 2014-04-10 MED ORDER — NALBUPHINE HCL 10 MG/ML IJ SOLN: 5.0000 mg | Freq: Once | INTRAMUSCULAR | Status: AC | PRN

## 2014-04-10 MED ORDER — CEFAZOLIN SODIUM-DEXTROSE 2-3 GM-% IV SOLR
INTRAVENOUS | Status: AC
  Administered 2014-04-10: 2 g via INTRAVENOUS
  Filled 2014-04-10: qty 50

## 2014-04-10 MED ORDER — OXYCODONE-ACETAMINOPHEN 5-325 MG PO TABS
2.0000 | ORAL_TABLET | ORAL | Status: DC | PRN
  Administered 2014-04-11 – 2014-04-13 (×11): 2 via ORAL
  Filled 2014-04-10 (×11): qty 2

## 2014-04-10 MED ORDER — PRENATAL MULTIVITAMIN CH
1.0000 | ORAL_TABLET | Freq: Every day | ORAL | Status: DC
  Administered 2014-04-11 – 2014-04-13 (×3): 1 via ORAL
  Filled 2014-04-10 (×3): qty 1

## 2014-04-10 MED ORDER — SODIUM CHLORIDE 0.9 % IJ SOLN
INTRAMUSCULAR | Status: AC
  Filled 2014-04-10: qty 20

## 2014-04-10 MED ORDER — PHENYLEPHRINE 8 MG IN D5W 100 ML (0.08MG/ML) PREMIX OPTIME
INJECTION | INTRAVENOUS | Status: AC
  Filled 2014-04-10: qty 100

## 2014-04-10 MED ORDER — ONDANSETRON HCL 4 MG PO TABS
4.0000 mg | ORAL_TABLET | ORAL | Status: DC | PRN
  Filled 2014-04-10: qty 1

## 2014-04-10 MED ORDER — MEPERIDINE HCL 25 MG/ML IJ SOLN: 6.2500 mg | INTRAMUSCULAR | Status: DC | PRN

## 2014-04-10 MED ORDER — PHENYLEPHRINE 40 MCG/ML (10ML) SYRINGE FOR IV PUSH (FOR BLOOD PRESSURE SUPPORT)
PREFILLED_SYRINGE | INTRAVENOUS | Status: AC
  Filled 2014-04-10: qty 5

## 2014-04-10 MED ORDER — ONDANSETRON HCL 4 MG/2ML IJ SOLN
INTRAMUSCULAR | Status: DC | PRN
  Administered 2014-04-10: 4 mg via INTRAVENOUS

## 2014-04-10 MED ORDER — DIBUCAINE 1 % RE OINT: 1.0000 "application " | TOPICAL_OINTMENT | RECTAL | Status: DC | PRN

## 2014-04-10 MED ORDER — MENTHOL 3 MG MT LOZG: 1.0000 | LOZENGE | OROMUCOSAL | Status: DC | PRN

## 2014-04-10 MED ORDER — CEFAZOLIN SODIUM-DEXTROSE 2-3 GM-% IV SOLR: 2.0000 g | Freq: Once | INTRAVENOUS | Status: DC

## 2014-04-10 MED ORDER — METOCLOPRAMIDE HCL 5 MG/ML IJ SOLN
INTRAMUSCULAR | Status: DC | PRN
  Administered 2014-04-10 (×2): 5 mg via INTRAVENOUS

## 2014-04-10 MED ORDER — IBUPROFEN 600 MG PO TABS
600.0000 mg | ORAL_TABLET | Freq: Four times a day (QID) | ORAL | Status: DC
  Administered 2014-04-11 – 2014-04-12 (×3): 600 mg via ORAL
  Filled 2014-04-10 (×4): qty 1

## 2014-04-10 MED ORDER — OXYCODONE-ACETAMINOPHEN 5-325 MG PO TABS
1.0000 | ORAL_TABLET | ORAL | Status: DC | PRN
  Administered 2014-04-11 – 2014-04-13 (×2): 1 via ORAL
  Filled 2014-04-10 (×2): qty 1

## 2014-04-10 MED ORDER — ONDANSETRON HCL 4 MG/2ML IJ SOLN: 4.0000 mg | INTRAMUSCULAR | Status: DC | PRN

## 2014-04-10 MED ORDER — SIMETHICONE 80 MG PO CHEW
80.0000 mg | CHEWABLE_TABLET | ORAL | Status: DC
  Administered 2014-04-13: 80 mg via ORAL
  Filled 2014-04-10 (×3): qty 1

## 2014-04-10 MED ORDER — SODIUM CHLORIDE 0.9 % IJ SOLN: 3.0000 mL | INTRAMUSCULAR | Status: DC | PRN

## 2014-04-10 MED ORDER — NALOXONE HCL 0.4 MG/ML IJ SOLN: 0.4000 mg | INTRAMUSCULAR | Status: DC | PRN

## 2014-04-10 MED ORDER — ONDANSETRON HCL 4 MG/2ML IJ SOLN
INTRAMUSCULAR | Status: AC
  Filled 2014-04-10: qty 2

## 2014-04-10 MED ORDER — SIMETHICONE 80 MG PO CHEW
80.0000 mg | CHEWABLE_TABLET | Freq: Three times a day (TID) | ORAL | Status: DC
  Administered 2014-04-10 – 2014-04-13 (×9): 80 mg via ORAL
  Filled 2014-04-10 (×9): qty 1

## 2014-04-10 MED ORDER — METOCLOPRAMIDE HCL 5 MG/ML IJ SOLN
INTRAMUSCULAR | Status: AC
Start: 2014-04-10 — End: 2014-04-10
  Filled 2014-04-10: qty 2

## 2014-04-10 MED ORDER — DIPHENHYDRAMINE HCL 50 MG/ML IJ SOLN: 12.5000 mg | INTRAMUSCULAR | Status: DC | PRN

## 2014-04-10 MED ORDER — KETOROLAC TROMETHAMINE 30 MG/ML IJ SOLN: 30.0000 mg | Freq: Four times a day (QID) | INTRAMUSCULAR | Status: AC | PRN

## 2014-04-10 MED ORDER — LACTATED RINGERS IV SOLN: INTRAVENOUS | Status: DC

## 2014-04-10 MED ORDER — WITCH HAZEL-GLYCERIN EX PADS: 1.0000 "application " | MEDICATED_PAD | CUTANEOUS | Status: DC | PRN

## 2014-04-10 MED ORDER — DIPHENHYDRAMINE HCL 25 MG PO CAPS: 25.0000 mg | ORAL_CAPSULE | Freq: Four times a day (QID) | ORAL | Status: DC | PRN

## 2014-04-10 MED ORDER — OXYTOCIN 10 UNIT/ML IJ SOLN
INTRAMUSCULAR | Status: AC
  Filled 2014-04-10: qty 4

## 2014-04-10 MED ORDER — KETOROLAC TROMETHAMINE 30 MG/ML IJ SOLN
INTRAMUSCULAR | Status: AC
  Filled 2014-04-10: qty 1

## 2014-04-10 MED ORDER — DIPHENHYDRAMINE HCL 25 MG PO CAPS: 25.0000 mg | ORAL_CAPSULE | ORAL | Status: DC | PRN

## 2014-04-10 MED ORDER — LACTATED RINGERS IV SOLN
Freq: Once | INTRAVENOUS | Status: AC
Start: 2014-04-10 — End: 2014-04-10
  Administered 2014-04-10: 13:00:00 via INTRAVENOUS

## 2014-04-10 MED ORDER — PROMETHAZINE HCL 25 MG/ML IJ SOLN: 6.2500 mg | INTRAMUSCULAR | Status: DC | PRN

## 2014-04-10 MED ORDER — FENTANYL CITRATE 0.05 MG/ML IJ SOLN
INTRAMUSCULAR | Status: AC
  Administered 2014-04-10: 50 ug via INTRAVENOUS
  Filled 2014-04-10: qty 2

## 2014-04-10 MED ORDER — OXYTOCIN 40 UNITS IN LACTATED RINGERS INFUSION - SIMPLE MED
INTRAVENOUS | Status: DC | PRN
  Administered 2014-04-10: 40 [IU] via INTRAVENOUS

## 2014-04-10 MED ORDER — KETOROLAC TROMETHAMINE 30 MG/ML IJ SOLN
15.0000 mg | Freq: Once | INTRAMUSCULAR | Status: AC | PRN
  Administered 2014-04-10: 30 mg via INTRAVENOUS

## 2014-04-10 MED ORDER — FENTANYL CITRATE 0.05 MG/ML IJ SOLN
INTRAMUSCULAR | Status: DC | PRN
  Administered 2014-04-10: 50 ug via INTRAVENOUS
  Administered 2014-04-10: 12.5 ug via INTRATHECAL
  Administered 2014-04-10: 37.5 ug via INTRAVENOUS

## 2014-04-10 MED ORDER — LANOLIN HYDROUS EX OINT
1.0000 | TOPICAL_OINTMENT | CUTANEOUS | Status: DC | PRN
Start: 2014-04-10 — End: 2014-04-13

## 2014-04-10 SURGICAL SUPPLY — 39 items
CLAMP CORD UMBIL (MISCELLANEOUS) IMPLANT
CLOTH BEACON ORANGE TIMEOUT ST (SAFETY) ×2 IMPLANT
CONTAINER PREFILL 10% NBF 15ML (MISCELLANEOUS) IMPLANT
DRAPE SHEET LG 3/4 BI-LAMINATE (DRAPES) IMPLANT
DRSG OPSITE POSTOP 4X10 (GAUZE/BANDAGES/DRESSINGS) ×2 IMPLANT
DURAPREP 26ML APPLICATOR (WOUND CARE) ×2 IMPLANT
ELECT REM PT RETURN 9FT ADLT (ELECTROSURGICAL) ×2
ELECTRODE REM PT RTRN 9FT ADLT (ELECTROSURGICAL) ×1 IMPLANT
EXTRACTOR VACUUM M CUP 4 TUBE (SUCTIONS) IMPLANT
GLOVE BIO SURGEON STRL SZ7.5 (GLOVE) ×2 IMPLANT
GOWN STRL REUS W/TWL LRG LVL3 (GOWN DISPOSABLE) ×4 IMPLANT
KIT ABG SYR 3ML LUER SLIP (SYRINGE) IMPLANT
NDL HYPO 25X1 1.5 SAFETY (NEEDLE) ×1 IMPLANT
NDL HYPO 25X5/8 SAFETYGLIDE (NEEDLE) IMPLANT
NDL SPNL 20GX3.5 QUINCKE YW (NEEDLE) IMPLANT
NEEDLE HYPO 25X1 1.5 SAFETY (NEEDLE) ×2 IMPLANT
NEEDLE HYPO 25X5/8 SAFETYGLIDE (NEEDLE) IMPLANT
NEEDLE SPNL 20GX3.5 QUINCKE YW (NEEDLE) IMPLANT
NS IRRIG 1000ML POUR BTL (IV SOLUTION) ×2 IMPLANT
PACK C SECTION WH (CUSTOM PROCEDURE TRAY) ×2 IMPLANT
PAD ABD 7.5X8 STRL (GAUZE/BANDAGES/DRESSINGS) ×1 IMPLANT
PAD ABD 8X7 1/2 STERILE (GAUZE/BANDAGES/DRESSINGS) ×1 IMPLANT
SPONGE GAUZE 4X4 12PLY STER LF (GAUZE/BANDAGES/DRESSINGS) ×2 IMPLANT
STAPLER VISISTAT 35W (STAPLE) IMPLANT
SUT MNCRL 0 VIOLET CTX 36 (SUTURE) ×2 IMPLANT
SUT MNCRL AB 3-0 PS2 27 (SUTURE) ×1 IMPLANT
SUT MON AB 2-0 CT1 27 (SUTURE) ×2 IMPLANT
SUT MON AB-0 CT1 36 (SUTURE) ×4 IMPLANT
SUT MONOCRYL 0 CTX 36 (SUTURE) ×2
SUT PLAIN 0 NONE (SUTURE) IMPLANT
SUT PLAIN 2 0 (SUTURE) ×4
SUT PLAIN 2 0 XLH (SUTURE) IMPLANT
SUT PLAIN ABS 2-0 CT1 27XMFL (SUTURE) IMPLANT
SYR 20CC LL (SYRINGE) IMPLANT
SYR CONTROL 10ML LL (SYRINGE) ×2 IMPLANT
TAPE CLOTH SURG 4X10 WHT LF (GAUZE/BANDAGES/DRESSINGS) ×1 IMPLANT
TOWEL OR 17X24 6PK STRL BLUE (TOWEL DISPOSABLE) ×2 IMPLANT
TRAY FOLEY CATH 14FR (SET/KITS/TRAYS/PACK) ×2 IMPLANT
WATER STERILE IRR 1000ML POUR (IV SOLUTION) ×2 IMPLANT

## 2014-04-10 NOTE — Addendum Note (Signed)
Addendum created 04/10/14 1519 by Leilani AbleFranklin Kellan Raffield, MD   Modules edited: Anesthesia Attestations

## 2014-04-10 NOTE — Op Note (Signed)
Cesarean Section Procedure Note  Indications: previous uterine incision Kerr x one  Pre-operative Diagnosis: 39 week 1 day pregnancy.  Post-operative Diagnosis: same  Surgeon: Lenoard AdenAAVON,Dellia Donnelly J   Assistants: Arita Missawson, CNM  Anesthesia: Local anesthesia 0.25.% bupivacaine and Spinal anesthesia  ASA Class: 2  Procedure Details  The patient was seen in the Holding Room. The risks, benefits, complications, treatment options, and expected outcomes were discussed with the patient.  The patient concurred with the proposed plan, giving informed consent. The risks of anesthesia, infection, bleeding and possible injury to other organs discussed. Injury to bowel, bladder, or ureter with possible need for repair discussed. Possible need for transfusion with secondary risks of hepatitis or HIV acquisition discussed. Post operative complications to include but not limited to DVT, PE and Pneumonia noted. The site of surgery properly noted/marked. The patient was taken to Operating Room # 9, identified as Mary Good and the procedure verified as C-Section Delivery. A Time Out was held and the above information confirmed.  After induction of anesthesia, the patient was draped and prepped in the usual sterile manner. A Pfannenstiel incision was made and carried down through the subcutaneous tissue to the fascia. Fascial incision was made and extended transversely using Mayo scissors. The fascia was separated from the underlying rectus tissue superiorly and inferiorly. The peritoneum was identified and entered. Peritoneal incision was extended longitudinally. The utero-vesical peritoneal reflection was incised transversely and the bladder flap was bluntly freed from the lower uterine segment. A low transverse uterine incision(Kerr hysterotomy) was made. Delivered from OA presentation was a  female with Apgar scores of 9 at one minute and 9 at five minutes. Bulb suctioning gently performed. Neonatal team in  attendance.After the umbilical cord was clamped and cut cord blood was obtained for evaluation. The placenta was removed intact and appeared normal. The uterus was curetted with a dry lap pack. Good hemostasis was noted.The uterine outline, tubes and ovaries appeared normal. The uterine incision was closed with running locked sutures of 0 Monocryl x 2 layers. Hemostasis was observed. Lavage was carried out until clear.The rectus fascia was closed with a running 2-0 Monocryl suture. The fascia was then reapproximated with running sutures of 0 Monocryl. The skin was reapproximated with 3-0 monocryl after [place] closure with 2-0 plain.  Instrument, sponge, and needle counts were correct prior to the abdominal closure and at the conclusion of the case.   Findings: FTLF, Anterior placenta Nl tubes, nl ovaries  Estimated Blood Loss:  300 mL         Drains: foley                 Specimens: placenta                 Complications:  None; patient tolerated the procedure well.         Disposition: PACU - hemodynamically stable.         Condition: stable  Attending Attestation: I performed the procedure.

## 2014-04-10 NOTE — Anesthesia Preprocedure Evaluation (Signed)
Anesthesia Evaluation  Patient identified by MRN, date of birth, ID band Patient awake    Reviewed: Allergy & Precautions, H&P , NPO status , Patient's Chart, lab work & pertinent test results  Airway Mallampati: II TM Distance: >3 FB Neck ROM: Full    Dental no notable dental hx. (+) Teeth Intact   Pulmonary former smoker,    Pulmonary exam normal       Cardiovascular negative cardio ROS  Rhythm:Regular Rate:Normal     Neuro/Psych negative neurological ROS  negative psych ROS   GI/Hepatic Neg liver ROS, GERD-  ,  Endo/Other  negative endocrine ROS  Renal/GU negative Renal ROS  negative genitourinary   Musculoskeletal   Abdominal Normal abdominal exam  (+)   Peds  Hematology  (+) anemia ,   Anesthesia Other Findings   Reproductive/Obstetrics (+) Pregnancy Previous C/Section                           Anesthesia Physical Anesthesia Plan  ASA: II  Anesthesia Plan: Epidural   Post-op Pain Management:    Induction: Intravenous  Airway Management Planned: Natural Airway  Additional Equipment:   Intra-op Plan:   Post-operative Plan: Extubation in OR  Informed Consent: I have reviewed the patients History and Physical, chart, labs and discussed the procedure including the risks, benefits and alternatives for the proposed anesthesia with the patient or authorized representative who has indicated his/her understanding and acceptance.   Dental advisory given  Plan Discussed with: CRNA, Anesthesiologist and Surgeon  Anesthesia Plan Comments:         Anesthesia Quick Evaluation

## 2014-04-10 NOTE — Anesthesia Postprocedure Evaluation (Signed)
Anesthesia Post Note  Patient: Mary Good  Procedure(s) Performed: Procedure(s) (LRB): REPEAT CESAREAN SECTION (N/A)  Anesthesia type: Spinal  Patient location: PACU  Post pain: Pain level controlled  Post assessment: Post-op Vital signs reviewed  Last Vitals:  Filed Vitals:   04/10/14 1500  BP: 101/58  Pulse: 73  Temp:   Resp: 22    Post vital signs: Reviewed  Level of consciousness: awake  Complications: No apparent anesthesia complications

## 2014-04-10 NOTE — Transfer of Care (Signed)
Immediate Anesthesia Transfer of Care Note  Patient: Mary Good  Procedure(s) Performed: Procedure(s) with comments: REPEAT CESAREAN SECTION (N/A) - EDD: 04/16/14  Patient Location: PACU  Anesthesia Type:Spinal  Level of Consciousness: awake, alert  and oriented  Airway & Oxygen Therapy: Patient Spontanous Breathing  Post-op Assessment: Report given to PACU RN and Post -op Vital signs reviewed and stable  Post vital signs: Reviewed and stable  Complications: No apparent anesthesia complications

## 2014-04-10 NOTE — Lactation Note (Signed)
This note was copied from the chart of Mary Gaye PollackLaura Sedita. Lactation Consultation Note Initial visit at 9 hours of age.  Mom reports baby is doing well and STS now on mom's chest.  Mom reports difficulty emotionally with older child attempt to breast feeding and not wanting to be alone and away from others when feeding.  [facility] given and discussed.  Encouraged to feed with early cues on demand.  Early newborn behavior discussed.  Hand expression done with colostrum visible per mom, not demonstrated at this time.  Mom to call for assist as needed.    Patient Name: Mary Good [id number]'s Date: 04/10/2014 Reason for consult: Initial assessment   Maternal Data Has patient been taught Hand Expression?: Yes Does the patient have breastfeeding experience prior to this delivery?: Yes  Feeding Feeding Type: Breast Fed  LATCH Score/Interventions Latch: Repeated attempts needed to sustain latch, nipple held in mouth throughout feeding, stimulation needed to elicit sucking reflex. Intervention(s): Adjust position;Assist with latch  Audible Swallowing: A few with stimulation Intervention(s): Skin to skin Intervention(s): Skin to skin  Type of Nipple: Everted at rest and after stimulation  Comfort (Breast/Nipple): Soft / non-tender     Hold (Positioning): Assistance needed to correctly position infant at breast and maintain latch. Intervention(s): Breastfeeding basics reviewed  LATCH Score: 7  Lactation Tools Discussed/Used Date initiated:: 04/10/14   Consult Status Consult Status: Follow-up Date: 04/11/14 Follow-up type: In-patient    Jannifer RodneyShoptaw, Dorella Laster Lynn 04/10/2014, 11:21 PM

## 2014-04-10 NOTE — Anesthesia Procedure Notes (Signed)
Spinal  Patient location during procedure: OR Start time: 04/10/2014 1:13 PM End time: 04/10/2014 1:17 PM Staffing Anesthesiologist: Leilani AbleHATCHETT, Danyel Tobey Performed by: anesthesiologist  Preanesthetic Checklist Completed: patient identified, surgical consent, pre-op evaluation, timeout performed, IV checked, risks and benefits discussed and monitors and equipment checked Spinal Block Patient position: sitting Prep: site prepped and draped and DuraPrep Patient monitoring: heart rate, cardiac monitor, continuous pulse ox and blood pressure Approach: midline Location: L3-4 Injection technique: single-shot Needle Needle type: Pencan  Needle gauge: 24 G Needle length: 9 cm Needle insertion depth: 5 cm Assessment Sensory level: T6 Events: paresthesia Additional Notes Not well localized paresthesia and it was not present at time of injection.

## 2014-04-10 NOTE — Progress Notes (Signed)
Patient ID: Mary DuosLaura S Petterson, female   DOB: 1980/09/23, 33 y.o.   MRN: [id number] Patient seen and examined. Consent witnessed and signed. No changes noted. Update completed.

## 2014-04-11 ENCOUNTER — Encounter (HOSPITAL_COMMUNITY): Payer: Self-pay | Admitting: Obstetrics and Gynecology

## 2014-04-11 DIAGNOSIS — B951 Streptococcus, group B, as the cause of diseases classified elsewhere: Secondary | ICD-10-CM | POA: Diagnosis present

## 2014-04-11 LAB — CBC
HCT: 33.8 % — ABNORMAL LOW (ref 36.0–46.0)
Hemoglobin: 11.7 g/dL — ABNORMAL LOW (ref 12.0–15.0)
MCH: 32.5 pg (ref 26.0–34.0)
MCHC: 34.6 g/dL (ref 30.0–36.0)
MCV: 93.9 fL (ref 78.0–100.0)
PLATELETS: 187 10*3/uL (ref 150–400)
RBC: 3.6 MIL/uL — ABNORMAL LOW (ref 3.87–5.11)
RDW: 12.2 % (ref 11.5–15.5)
WBC: 11.1 10*3/uL — ABNORMAL HIGH (ref 4.0–10.5)

## 2014-04-11 LAB — BIRTH TISSUE RECOVERY COLLECTION (PLACENTA DONATION)

## 2014-04-11 NOTE — Progress Notes (Addendum)
POD # 1  Subjective: Pt reports feeling well/ Pain controlled with Motrin and Percocet Tolerating po/ Foley draining/ No n/v/ Flatus absent Activity: ad lib Bleeding is light Newborn info:  Information for the patient's newborn:  [name], [name]  female Feeding: breast   Objective:  VS:  Filed Vitals:   04/11/14 0700 04/11/14 0852 04/11/14 0855 04/11/14 0857  BP: 90/56 114/60 98/45 96/63   Pulse: 76 76 63 93  Temp: 98.1 F (36.7 C) 98.2 F (36.8 C)    TempSrc:  Oral    Resp: 18 16    Weight:      SpO2: 97% 96% 97% 99%     I&O: Intake/Output     10/21 0701 - 10/22 0700 10/22 0701 - 10/23 0700   P.O. 640 220   I.V. (mL/kg) 2300 (32.5)    Total Intake(mL/kg) 2940 (41.5) 220 (3.1)   Urine (mL/kg/hr) 1675 600 (2.7)   Blood 600    Total Output 2275 600   Net +665 -380        Urine Occurrence 1200 x       Recent Labs  04/11/14 0555  WBC 11.1*  HGB 11.7*  HCT 33.8*  PLT 187    Blood type: --/--/A POS, A POS (10/19 1005) Rubella: Immune (03/24 0000)    Physical Exam:  General: alert and cooperative CV: Regular rate and rhythm Resp: CTA bilaterally Abdomen: soft, nontender, normal bowel sounds Incision: no drainage, no erythema, no swelling, pressure dsg c/d/i Uterine Fundus: firm, below umbilicus, nontender GU: Foley to SD, clear light yellow Lochia: minimal Ext: extremities normal, atraumatic, no cyanosis or edema and Homans sign is negative, no sign of DVT    Assessment: POD # 1/ G2P2002/ S/P C/Section d/t repeat Rubella non-immune Doing well  Plan: Ambulate Continue routine post op orders   Signed: Donette LarryBHAMBRI, Mary Good, N, MSN, CNM 04/11/2014, 10:10 AM

## 2014-04-11 NOTE — Progress Notes (Signed)
RN in room at 0500. PT very disoriented during night due to ??, Pt talking to husband about him being in the nursery in the next room at her home. Pt stating it was Monday instead of Wednesday. Pt wanting to get up  And could not raise her bottom off bed. PT falling asleep at intervals during conversation. Husband aware of disorientation. Pt stood at bed side for 3 minutes and sat back down due to heavy legs and disorientation

## 2014-04-11 NOTE — Anesthesia Postprocedure Evaluation (Signed)
  Anesthesia Post-op Note  Patient: Mary Good  Procedure(s) Performed: Procedure(s) with comments: REPEAT CESAREAN SECTION (N/A) - EDD: 04/16/14  Patient Location: Mother/Baby  Anesthesia Type:Spinal  Level of Consciousness: awake, alert , oriented and patient cooperative  Airway and Oxygen Therapy: Patient Spontanous Breathing  Post-op Pain: mild  Post-op Assessment: Post-op Vital signs reviewed, Patient's Cardiovascular Status Stable, Respiratory Function Stable, Patent Airway, No headache, No backache, No residual numbness and No residual motor weakness  Post-op Vital Signs: Reviewed and stable  Last Vitals:  Filed Vitals:   04/11/14 0700  BP: 90/56  Pulse: 76  Temp: 36.7 C  Resp: 18    Complications: No apparent anesthesia complications

## 2014-04-11 NOTE — Addendum Note (Signed)
Addendum created 04/11/14 0843 by Yolonda KidaAlison L Silena Wyss, CRNA   Modules edited: Notes Section   Notes Section:  File: [id number]

## 2014-04-12 MED ORDER — IBUPROFEN 800 MG PO TABS
800.0000 mg | ORAL_TABLET | Freq: Three times a day (TID) | ORAL | Status: DC
  Administered 2014-04-12 – 2014-04-13 (×4): 800 mg via ORAL
  Filled 2014-04-12 (×4): qty 1

## 2014-04-12 NOTE — Lactation Note (Signed)
This note was copied from the chart of Mary Gaye PollackLaura Hillman. Lactation Consultation Note; Attempted to visit mom but several visitors just coming into room and mom asks that I come by later.   Patient Name: Mary Good WUJWJ'XToday's Date: 04/12/2014     Maternal Data    Feeding   LATCH Score/Interventions                      Lactation Tools Discussed/Used     Consult Status      Pamelia HoitWeeks, Stanislawa Gaffin D 04/12/2014, 12:51 PM

## 2014-04-12 NOTE — Lactation Note (Signed)
This note was copied from the chart of Mary Gaye PollackLaura Roache. Lactation Consultation Note: Follow up visit with mom. She reports that her nipples are sore and her breasts are beginning to feel tender. Mature milk starting to come in. Suggested mom use football position- has been using cradle every time. Nipples pink- few blisters noted. Has comfort gels. Mom reports that football position feels comfortable. Baby nursed for 10 min then off to sleep. Mom reports that breast feels softer. No further questions at present. Encouragement given. To call for assist prn  Patient Name: Mary [name] RUEAV'WToday's Date: 04/12/2014 Reason for consult: Follow-up assessment   Maternal Data    Feeding Feeding Type: Breast Fed Length of feed: 10 min  LATCH Score/Interventions Latch: Grasps breast easily, tongue down, lips flanged, rhythmical sucking.  Audible Swallowing: Spontaneous and intermittent  Type of Nipple: Everted at rest and after stimulation  Comfort (Breast/Nipple): Filling, red/small blisters or bruises, mild/mod discomfort  Problem noted: Mild/Moderate discomfort;Filling Interventions (Mild/moderate discomfort): Comfort gels  Hold (Positioning): Assistance needed to correctly position infant at breast and maintain latch. Intervention(s): Breastfeeding basics reviewed;Position options  LATCH Score: 8  Lactation Tools Discussed/Used     Consult Status Consult Status: Follow-up Date: 04/13/14 Follow-up type: In-patient    Pamelia HoitWeeks, Nazaiah Navarrete D 04/12/2014, 4:00 PM

## 2014-04-12 NOTE — Progress Notes (Signed)
POSTOPERATIVE DAY # 2 S/P CS  S:         Reports feeling more sore and pain today             Tolerating po intake / no nausea / no vomiting / some flatus / no BM             Bleeding is light             Pain minimally controlled with motrin and percocet             Up ad lib / ambulatory/ voiding QS  Newborn breast feeding  / female - reports losing weight as no breast milk yet   O:  VS: BP 95/68  Pulse 67  Temp(Src) 97.8 F (36.6 C) (Oral)  Resp 18  Wt 70.8 kg (156 lb 1.4 oz)  SpO2 99%  LMP 07/10/2013  Breastfeeding? Unknown   LABS:               Recent Labs  04/11/14 0555  WBC 11.1*  HGB 11.7*  PLT 187               Bloodtype: --/--/A POS, A POS (10/19 1005)  Rubella: Immune (03/24 0000)                                             I&O: Intake/Output     10/22 0701 - 10/23 0700 10/23 0701 - 10/24 0700   P.O. 580    I.V. (mL/kg)     Total Intake(mL/kg) 580 (8.2)    Urine (mL/kg/hr) 2100 (1.2)    Blood     Total Output 2100     Net -1520                       Physical Exam:             Alert and Oriented X3  Lungs: Clear and unlabored  Heart: regular rate and rhythm / no mumurs  Abdomen: soft, non-tender, moderate distention, active BS             Fundus: firm, non-tender, Ueven             Dressing intact honeycomb              Incision:  approximated with suture / no erythema / no ecchymosis / no drainage  Perineum: intact  Lochia: light  Extremities: trace edema, no calf pain or tenderness, negative Homans  A:        POD # 2 S/P CS             P:        Routine postoperative care              Increase water / warm fluids & soups / ambulation / warm K-pad to abdomen to facilitate bowel motility  Reviewed common for breast milk not to come in for additional day with surgery - all babies will lose weight initially  then regain by 432 weeks of age / encouraged frequent feedings - pumping to stimulate milk - hydration with water /  anticipate milk to be in  by end of weekend               DC tomorrow     Marlinda MikeBAILEY, TANYA CNM, MSN, [facility] 04/12/2014,  10:12 AM

## 2014-04-13 MED ORDER — PROMETHAZINE HCL 25 MG PO TABS
12.5000 mg | ORAL_TABLET | Freq: Four times a day (QID) | ORAL | Status: DC | PRN
  Administered 2014-04-13: 12.5 mg via ORAL
  Filled 2014-04-13: qty 1

## 2014-04-13 MED ORDER — IBUPROFEN 800 MG PO TABS: 800.0000 mg | ORAL_TABLET | Freq: Three times a day (TID) | ORAL | Status: DC

## 2014-04-13 MED ORDER — OXYCODONE-ACETAMINOPHEN 5-325 MG PO TABS: 1.0000 | ORAL_TABLET | ORAL | Status: DC | PRN

## 2014-04-13 MED ORDER — PROMETHAZINE HCL 25 MG RE SUPP: 12.5000 mg | Freq: Four times a day (QID) | RECTAL | Status: DC | PRN

## 2014-04-13 MED ORDER — PROMETHAZINE HCL 12.5 MG PO TABS: 12.5000 mg | ORAL_TABLET | Freq: Four times a day (QID) | ORAL | Status: DC | PRN

## 2014-04-13 NOTE — Discharge Summary (Signed)
POSTOPERATIVE DISCHARGE SUMMARY:  Patient ID: Mary Good MRN: [id number] DOB/AGE: [birth year] 32 y.o.  Admit date: 04/10/2014 Admission Diagnoses: Repeat Cesarean Delivery   Discharge date:  04/13/2014 Discharge Diagnoses: S/P C/S due to Repeat on 04/10/2014        Prenatal history: G2P2002   EDC : 04/16/2014, by Last Menstrual Period  [facility] [redacted] wks gestation. Primary provider : Dr. Billy Coastaavon Prenatal course complicated by Thrombosed Hemorrhoids  Prenatal Labs: ABO, Rh: A POS (10/19 1005)  Antibody: NEG (10/19 1005) Rubella: Immune (03/24 0000)  RPR: NON REAC (10/19 1005)  HBsAg: Negative (03/24 0000)  HIV: Non-reactive (03/24 0000)  GTT : Normal GBS:  Positive  Medical / Surgical History :  Past medical history:  Past Medical History  Diagnosis Date  . Acute maxillary sinusitis     History  . History of chicken pox   . PP care - s/p C/S 2/25 08/17/2011  . Acute blood loss anemia 08/17/2011    history with 2013 pregnancy    Past surgical history:  Past Surgical History  Procedure Laterality Date  . Wisdom tooth extraction  2004  . Cesarean section  08/16/2011    Procedure: CESAREAN SECTION;  Surgeon: Lenoard Adenichard J Taavon, MD;  Location: [facility];  Service: Gynecology;  Laterality: N/A;  . Cesarean section N/A 04/10/2014    Procedure: REPEAT CESAREAN SECTION;  Surgeon: Lenoard Adenichard J Taavon, MD;  Location: [facility];  Service: Obstetrics;  Laterality: N/A;  EDD: 04/16/14     Allergies: Penicillins and Hydrocodone   Intrapartum Course:  Admited for repeat cesarean delivery by Dr. Billy Coastaavon / see operative note  Physical Exam:   VSS: Blood pressure 110/58, pulse 79, temperature 97.9 F (36.6 C), temperature source Oral, resp. rate 18, weight 70.8 kg (156 lb 1.4 oz), last menstrual period 07/10/2013, SpO2 99.00%, currently breastfeeding.  LABS:  Recent Labs  04/11/14 0555  WBC 11.1*  HGB 11.7*  PLT 187     Newborn Data Live born female on 04/10/2014 Birth Weight: 6 lb 9.8 oz (3000 g) APGAR: 9, 9  See operative report for further details  Home with mother.  Discharge Instructions:  Wound Care: keep clean and dry / remove honeycomb POD 7 Postpartum Instructions: [facility] discharge booklet - instructions reviewed Medications:    Medication List         acetaminophen 500 MG tablet  Commonly known as:  TYLENOL  Take 500 mg by mouth every 6 (six) hours as needed for moderate pain or headache.     ibuprofen 800 MG tablet  Commonly known as:  ADVIL,MOTRIN  Take 1 tablet (800 mg total) by mouth every 8 (eight) hours.     multivitamin-prenatal 27-0.8 MG Tabs tablet  Take 1 tablet by mouth daily at 12 noon.     oxyCODONE-acetaminophen 5-325 MG per tablet  Commonly known as:  PERCOCET/ROXICET  Take 1 tablet by mouth every 4 (four) hours as needed (for pain scale less than 7).     promethazine 12.5 MG tablet  Commonly known as:  PHENERGAN  Take 1 tablet (12.5 mg total) by mouth every 6 (six) hours as needed for nausea or vomiting.           Follow-up Information   Follow up with Lenoard AdenAAVON,RICHARD J, MD. Schedule an appointment as soon as possible for a visit in 6 weeks. (postpartum visit)    Specialty:  Obstetrics and Gynecology   Contact information:   [phone]  [address] [place] [id number] [phone]         Signed: Kenard GowerAWSON, Camani Sesay, M, MSN, CNM 04/13/2014, 10:56 AM

## 2014-04-13 NOTE — Progress Notes (Signed)
POSTOPERATIVE DAY # 3 S/P CS  S:         Reports feeling more sore and pain today             Tolerating po intake / no nausea / no vomiting / some flatus / no BM             Bleeding is light             Pain minimally controlled with motrin and percocet             Up ad lib / ambulatory/ voiding QS  Newborn breast feeding  / female - reports losing weight as no breast milk yet   O:  VS: BP 110/58  Pulse 79  Temp(Src) 97.9 F (36.6 C) (Oral)  Resp 18  Wt 70.8 kg (156 lb 1.4 oz)  SpO2 99%  LMP 07/10/2013  Breastfeeding? Unknown   LABS:                Recent Labs  04/11/14 0555  WBC 11.1*  HGB 11.7*  PLT 187               Bloodtype: --/--/A POS, A POS (10/19 1005)  Rubella: Immune (03/24 0000)                                             Physical Exam:             Alert and Oriented X3  Lungs: Clear and unlabored  Heart: regular rate and rhythm / no mumurs  Abdomen: soft, non-tender, moderate distention, active BS             Fundus: firm, non-tender, U-1             Dressing intact honeycomb - skin well-approximated with sutures             Incision:  approximated with suture / no erythema / no ecchymosis / no drainage  Perineum: intact  Lochia: light  Extremities: trace edema, no calf pain or tenderness, negative Homans  A:        POD # 3 S/P Repeat CS             P:        Routine postoperative care              Increase water / warm fluids & soups / avoid straws / ambulation / warm K-pad to abdomen to facilitate bowel motility   D/C home today     Raelyn MoraDAWSON, Jalisa Sacco, M MSN, CNM  04/13/2014, 10:30 AM

## 2014-04-13 NOTE — Discharge Instructions (Signed)
Breast Pumping Tips °If you are breastfeeding, there may be times when you cannot feed your baby directly. Returning to work or going on a trip are common examples. Pumping allows you to store breast milk and feed it to your baby later.  °You may not get much milk when you first start to pump. Your breasts should start to make more after a few days. If you pump at the times you usually feed your baby, you may be able to keep making enough milk to feed your baby without also using formula. The more often you pump, the more milk you will produce.  °WHEN SHOULD I PUMP?  °· You can begin to pump soon after delivery. However, some experts recommend waiting about 4 weeks before giving your infant a bottle to make sure breastfeeding is going well.  °· If you plan to return to work, begin pumping a few weeks before. This will help you develop techniques that work best for you. It also lets you build up a supply of breast milk.   °· When you are with your infant, feed on demand and pump after each feeding.   °· When you are away from your infant for several hours, pump for about 15 minutes every 2-3 hours. Pump both breasts at the same time if you can.   °· If your infant has a formula feeding, make sure to pump around the same time.     °· If you drink any alcohol, wait 2 hours before pumping.   °HOW DO I PREPARE TO PUMP? °Your let-down reflex is the natural reaction to stimulation that makes your breast milk flow. It is easier to stimulate this reflex when you are relaxed. Find relaxation techniques that work for you. If you have difficulty with your let-down reflex, try these methods:  °· Smell one of your infant's blankets or an item of clothing.   °· Look at a picture or video of your infant.   °· Sit in a quiet, private space.   °· Massage the breast you plan to pump.   °· Place soothing warmth on the breast.   °· Play relaxing music.   °WHAT ARE SOME GENERAL BREAST PUMPING TIPS? °· Wash your hands before you pump. You  do not need to wash your nipples or breasts. °· There are three ways to pump. °¨ You can use your hand to massage and compress your breast. °¨ You can use a handheld manual pump. °¨ You can use an electric pump.   °· Make sure the suction cup (flange) on the breast pump is the right size. Place the flange directly over the nipple. If it is the wrong size or placed the wrong way, it may be painful and cause nipple damage.   °· If pumping is uncomfortable, apply a small amount of purified or modified lanolin to your nipple and areola. °· If you are using an electric pump, adjust the speed and suction power to be more comfortable. °· If pumping is painful or if you are not getting very much milk, you may need a different type of pump. A lactation consultant can help you determine what type of pump to use.   °· Keep a full water bottle near you at all times. Drinking lots of fluid helps you make more milk.  °· You can store your milk to use later. Pumped breast milk can be stored in a sealable, sterile container or plastic bag. Label all stored breast milk with the date you pumped it. °¨ Milk can stay out at room temperature for up to 8 hours. °¨   You can store your milk in the refrigerator for up to 8 days. °¨ You can store your milk in the freezer for 3 months. Thaw frozen milk using warm water. Do not put it in the microwave. °· Do not smoke. Smoking can lower your milk supply and harm your infant. If you need help quitting, ask your health care provider to recommend a program.   °WHEN SHOULD I CALL MY HEALTH CARE PROVIDER OR A LACTATION CONSULTANT? °· You are having trouble pumping. °· You are concerned that you are not making enough milk. °· You have nipple pain, soreness, or redness. °· You want to use birth control. Birth control pills may lower your milk supply. Talk to your health care provider about your options. °Document Released: 11/25/2009 Document Revised: 06/12/2013 Document Reviewed:  03/30/2013 °Exit[employer]® Patient Information ©2015 [employer]. This information is not intended to replace advice given to you by your health care provider. Make sure you discuss any questions you have with your health care provider. ° °Nutrition for the New Mother  °A new mother needs good health and nutrition so she can have energy to take care of a new baby. Whether a mother breastfeeds or formula feeds the baby, it is important to have a well-balanced diet. Foods from all the food groups should be chosen to meet the new mother's energy needs and to give her the nutrients needed for repair and healing.  °A HEALTHY EATING PLAN °The My [employer] plan for Moms outlines what you should eat to help you and your baby stay healthy. The energy and amount of food you need depends on whether or not you are breastfeeding. If you are breastfeeding you will need more nutrients. If you choose not to breastfeed, your nutrition goal should be to return to a healthy weight. Limiting calories may be needed if you are not breastfeeding.  °HOME CARE INSTRUCTIONS  °· For a personal plan based on your unique needs, see your Registered Dietitian or visit www.mypyramid.gov. °· Eat a variety of foods. The plan below will help guide you. The following chart has a suggested daily meal plan from the [employer] for Moms. °· Eat a variety of fruits and vegetables. °· Eat more dark green and orange vegetables and cooked dried beans. °· Make half your grains whole grains. Choose whole instead of refined grains. °· Choose low-fat or lean meats and poultry. °· Choose low-fat or fat-free dairy products like milk, cheese, or yogurt. °Fruits °· Breastfeeding: 2 cups °· Non-Breastfeeding: 2 cups °· What Counts as a serving? °¨ 1 cup of fruit or juice. °¨ ½ cup dried fruit. °Vegetables °· Breastfeeding: 3 cups °· Non-Breastfeeding: 2 ½ cups °· What Counts as a serving? °¨ 1 cup raw or cooked vegetables. °¨ Juice or 2 cups raw leafy  vegetables. °Grains °· Breastfeeding: 8 oz °· Non-Breastfeeding: 6 oz °· What Counts as a serving? °¨ 1 slice bread. °¨ 1 oz ready-to-eat cereal. °¨ ½ cup cooked pasta, rice, or cereal. °Meat and Beans °· Breastfeeding: 6 ½ oz °· Non-Breastfeeding: 5 ½ oz °· What Counts as a serving? °¨ 1 oz lean meat, poultry, or fish °¨ ¼ cup cooked dry beans °¨ ½ oz nuts or 1 egg °¨ 1 tbs peanut butter °Milk °· Breastfeeding: 3 cups °· Non-Breastfeeding: 3 cups °· What Counts as a serving? °¨ 1 cup milk. °¨ 8 oz yogurt. °¨ 1 ½ oz cheese. °¨ 2 oz processed cheese. °TIPS FOR THE BREASTFEEDING MOM °· Rapid weight   loss is not suggested when you are breastfeeding. By simply breastfeeding, you will be able to lose the weight gained during your pregnancy. Your caregiver can keep track of your weight and tell you if your weight loss is appropriate. °· Be sure to drink fluids. You may notice that you are thirstier than usual. A suggestion is to drink a glass of water or other beverage whenever you breastfeed. °· Avoid alcohol as it can be passed into your breast milk. °· Limit caffeine drinks to no more than 2 to 3 cups per day. °· You may need to keep taking your prenatal vitamin while you are breastfeeding. Talk with your caregiver about taking a vitamin or supplement. °RETURING TO A HEALTHY WEIGHT °· The My [employer] Plan for Moms will help you return to a healthy weight. It will also provide the nutrients you need. °· You may need to limit "empty" calories. These include: °¨ High fat foods like fried foods, fatty meats, fast food, butter, and mayonnaise. °¨ High sugar foods like sodas, jelly, candy, and sweets. °· Be physically active. Include 30 minutes of exercise or more each day. Choose an activity you like such as walking, swimming, biking, or aerobics. Check with your caregiver before you start to exercise. °Document Released: 09/14/2007 Document Revised: 08/30/2011 Document Reviewed: 09/14/2007 °ExitCare® Patient Information  ©2015 [employer]. This information is not intended to replace advice given to you by your health care provider. Make sure you discuss any questions you have with your health care provider. °Postpartum Depression and Baby Blues °The postpartum period begins right after the birth of a baby. During this time, there is often a great amount of joy and excitement. It is also a time of many changes in the life of the parents. Regardless of how many times a mother gives birth, each child brings new challenges and dynamics to the family. It is not unusual to have feelings of excitement along with confusing shifts in moods, emotions, and thoughts. All mothers are at risk of developing postpartum depression or the "baby blues." These mood changes can occur right after giving birth, or they may occur many months after giving birth. The baby blues or postpartum depression can be mild or severe. Additionally, postpartum depression can go away rather quickly, or it can be a long-term condition.  °CAUSES °Raised hormone levels and the rapid drop in those levels are thought to be a main cause of postpartum depression and the baby blues. A number of hormones change during and after pregnancy. Estrogen and progesterone usually decrease right after the delivery of your baby. The levels of thyroid hormone and various cortisol steroids also rapidly drop. Other factors that play a role in these mood changes include major life events and genetics.  °RISK FACTORS °If you have any of the following risks for the baby blues or postpartum depression, know what symptoms to watch out for during the postpartum period. Risk factors that may increase the likelihood of getting the baby blues or postpartum depression include: °· Having a personal or family history of depression.   °· Having depression while being pregnant.   °· Having premenstrual mood issues or mood issues related to oral contraceptives. °· Having a lot of life stress.   °· Having  marital conflict.   °· Lacking a social support network.   °· Having a baby with special needs.   °· Having health problems, such as diabetes.   °SIGNS AND SYMPTOMS °Symptoms of baby blues include: °· Brief changes in mood, such as going   from extreme happiness to sadness. °· Decreased concentration.   °· Difficulty sleeping.   °· Crying spells, tearfulness.   °· Irritability.   °· Anxiety.   °Symptoms of postpartum depression typically begin within the first month after giving birth. These symptoms include: °· Difficulty sleeping or excessive sleepiness.   °· Marked weight loss.   °· Agitation.   °· Feelings of worthlessness.   °· Lack of interest in activity or food.   °Postpartum psychosis is a very serious condition and can be dangerous. Fortunately, it is rare. Displaying any of the following symptoms is cause for immediate medical attention. Symptoms of postpartum psychosis include:  °· Hallucinations and delusions.   °· Bizarre or disorganized behavior.   °· Confusion or disorientation.   °DIAGNOSIS  °A diagnosis is made by an evaluation of your symptoms. There are no medical or lab tests that lead to a diagnosis, but there are various questionnaires that a health care provider may use to identify those with the baby blues, postpartum depression, or psychosis. Often, a screening tool called the Edinburgh Postnatal Depression Scale is used to diagnose depression in the postpartum period.  °TREATMENT °The baby blues usually goes away on its own in 1-2 weeks. Social support is often all that is needed. You will be encouraged to get adequate sleep and rest. Occasionally, you may be given medicines to help you sleep.  °Postpartum depression requires treatment because it can last several months or longer if it is not treated. Treatment may include individual or group therapy, medicine, or both to address any social, physiological, and psychological factors that may play a role in the depression. Regular exercise, a  healthy diet, rest, and social support may also be strongly recommended.  °Postpartum psychosis is more serious and needs treatment right away. Hospitalization is often needed. °HOME CARE INSTRUCTIONS °· Get as much rest as you can. Nap when the baby sleeps.   °· Exercise regularly. Some women find yoga and walking to be beneficial.   °· Eat a balanced and nourishing diet.   °· Do little things that you enjoy. Have a cup of tea, take a bubble bath, read your favorite magazine, or listen to your favorite music. °· Avoid alcohol.   °· Ask for help with household chores, cooking, grocery shopping, or running errands as needed. Do not try to do everything.   °· Talk to people close to you about how you are feeling. Get support from your partner, family members, friends, or other new moms. °· Try to stay positive in how you think. Think about the things you are grateful for.   °· Do not spend a lot of time alone.   °· Only take over-the-counter or prescription medicine as directed by your health care provider. °· Keep all your postpartum appointments.   °· Let your health care provider know if you have any concerns.   °SEEK MEDICAL CARE IF: °You are having a reaction to or problems with your medicine. °SEEK IMMEDIATE MEDICAL CARE IF: °· You have suicidal feelings.   °· You think you may harm the baby or someone else. °MAKE SURE YOU: °· Understand these instructions. °· Will watch your condition. °· Will get help right away if you are not doing well or get worse. °Document Released: 03/11/2004 Document Revised: 06/12/2013 Document Reviewed: 03/19/2013 °Exit[employer]® Patient Information ©2015 [employer]. This information is not intended to replace advice given to you by your health care provider. Make sure you discuss any questions you have with your health care provider. °Breastfeeding and Mastitis °Mastitis is inflammation of the breast tissue. It can occur in women who   are breastfeeding. This can make breastfeeding  painful. Mastitis will sometimes go away on its own. Your health care provider will help determine if treatment is needed. °CAUSES °Mastitis is often associated with a blocked milk (lactiferous) duct. This can happen when too much milk builds up in the breast. Causes of excess milk in the breast can include: °· Poor latch-on. If your baby is not latched onto the breast properly, she or he may not empty your breast completely while breastfeeding. °· Allowing too much time to pass between feedings. °· Wearing a bra or other clothing that is too tight. This puts extra pressure on the lactiferous ducts so milk does not flow through them as it should. °Mastitis can also be caused by a bacterial infection. Bacteria may enter the breast tissue through cuts or openings in the skin. In women who are breastfeeding, this may occur because of cracked or irritated skin. Cracks in the skin are often caused when your baby does not latch on properly to the breast. °SIGNS AND SYMPTOMS °· Swelling, redness, tenderness, and pain in an area of the breast. °· Swelling of the glands under the arm on the same side. °· Fever may or may not accompany mastitis. °If an infection is allowed to progress, a collection of pus (abscess) may develop. °DIAGNOSIS  °Your health care provider can usually diagnose mastitis based on your symptoms and a physical exam. Tests may be done to help confirm the diagnosis. These may include: °· Removal of pus from the breast by applying pressure to the area. This pus can be examined in the lab to determine which bacteria are present. If an abscess has developed, the fluid in the abscess can be removed with a needle. This can also be used to confirm the diagnosis and determine the bacteria present. In most cases, pus will not be present. °· Blood tests to determine if your body is fighting a bacterial infection. °· Mammogram or ultrasound tests to rule out other problems or diseases. °TREATMENT  °Mastitis that  occurs with breastfeeding will sometimes go away on its own. Your health care provider may choose to wait 24 hours after first seeing you to decide whether a prescription medicine is needed. If your symptoms are worse after 24 hours, your health care provider will likely prescribe an antibiotic medicine to treat the mastitis. He or she will determine which bacteria are most likely causing the infection and will then select an appropriate antibiotic medicine. This is sometimes changed based on the results of tests performed to identify the bacteria, or if there is no response to the antibiotic medicine selected. Antibiotic medicines are usually given by mouth. You may also be given medicine for pain. °HOME CARE INSTRUCTIONS °· Only take over-the-counter or prescription medicines for pain, fever, or discomfort as directed by your health care provider. °· If your health care provider prescribed an antibiotic medicine, take the medicine as directed. Make sure you finish it even if you start to feel better. °· Do not wear a tight or underwire bra. Wear a soft, supportive bra. °· Increase your fluid intake, especially if you have a fever. °· Continue to empty the breast. Your health care provider can tell you whether this milk is safe for your infant or needs to be thrown out. You may be told to stop nursing until your health care provider thinks it is safe for your baby. Use a breast pump if you are advised to stop nursing. °· Keep your nipples   clean and dry. °· Empty the first breast completely before going to the other breast. If your baby is not emptying your breasts completely for some reason, use a breast pump to empty your breasts. °· If you go back to work, pump your breasts while at work to stay in time with your nursing schedule. °· Avoid allowing your breasts to become overly filled with milk (engorged). °SEEK MEDICAL CARE IF: °· You have pus-like discharge from the breast. °· Your symptoms do not improve with  the treatment prescribed by your health care provider within 2 days. °SEEK IMMEDIATE MEDICAL CARE IF: °· Your pain and swelling are getting worse. °· You have pain that is not controlled with medicine. °· You have a red line extending from the breast toward your armpit. °· You have a fever or persistent symptoms for more than 2-3 days. °· You have a fever and your symptoms suddenly get worse. °MAKE SURE YOU:  °· Understand these instructions. °· Will watch your condition. °· Will get help right away if you are not doing well or get worse. °Document Released: 10/02/2004 Document Revised: 06/12/2013 Document Reviewed: 01/11/2013 °[employer]® Patient Information ©2015 [employer]. This information is not intended to replace advice given to you by your health care provider. Make sure you discuss any questions you have with your health care provider. °Breastfeeding °Deciding to breastfeed is one of the best choices you can make for you and your baby. A change in hormones during pregnancy causes your breast tissue to grow and increases the number and size of your milk ducts. These hormones also allow proteins, sugars, and fats from your blood supply to make breast milk in your milk-producing glands. Hormones prevent breast milk from being released before your baby is born as well as prompt milk flow after birth. Once breastfeeding has begun, thoughts of your baby, as well as his or her sucking or crying, can stimulate the release of milk from your milk-producing glands.  °BENEFITS OF BREASTFEEDING °For Your Baby °· Your first milk (colostrum) helps your baby's digestive system function better.   °· There are antibodies in your milk that help your baby fight off infections.   °· Your baby has a lower incidence of asthma, allergies, and sudden infant death syndrome.   °· The nutrients in breast milk are better for your baby than infant formulas and are designed uniquely for your baby's needs.   °· Breast milk improves your  baby's brain development.   °· Your baby is less likely to develop other conditions, such as childhood obesity, asthma, or type 2 diabetes mellitus.   °For You  °· Breastfeeding helps to create a very special bond between you and your baby.   °· Breastfeeding is convenient. Breast milk is always available at the correct temperature and costs nothing.   °· Breastfeeding helps to burn calories and helps you lose the weight gained during pregnancy.   °· Breastfeeding makes your uterus contract to its prepregnancy size faster and slows bleeding (lochia) after you give birth.   °· Breastfeeding helps to lower your risk of developing type 2 diabetes mellitus, osteoporosis, and breast or ovarian cancer later in life. °SIGNS THAT YOUR BABY IS HUNGRY °Early Signs of Hunger  °· Increased alertness or activity. °· Stretching. °· Movement of the head from side to side. °· Movement of the head and opening of the mouth when the corner of the mouth or cheek is stroked (rooting). °· Increased sucking sounds, smacking lips, cooing, sighing, or squeaking. °· Hand-to-mouth movements. °· Increased sucking of   fingers or hands. °Late Signs of Hunger °· Fussing. °· Intermittent crying. °Extreme Signs of Hunger °Signs of extreme hunger will require calming and consoling before your baby will be able to breastfeed successfully. Do not wait for the following signs of extreme hunger to occur before you initiate breastfeeding:   °· Restlessness. °· A loud, strong cry. °·  Screaming. °BREASTFEEDING BASICS °Breastfeeding Initiation °· Find a comfortable place to sit or lie down, with your neck and back well supported. °· Place a pillow or rolled up blanket under your baby to bring him or her to the level of your breast (if you are seated). Nursing pillows are specially designed to help support your arms and your baby while you breastfeed. °· Make sure that your baby's abdomen is facing your abdomen.   °· Gently massage your breast. With your  fingertips, massage from your chest wall toward your nipple in a circular motion. This encourages milk flow. You may need to continue this action during the feeding if your milk flows slowly. °· Support your breast with 4 fingers underneath and your thumb above your nipple. Make sure your fingers are well away from your nipple and your baby's mouth.   °· Stroke your baby's lips gently with your finger or nipple.   °· When your baby's mouth is open wide enough, quickly bring your baby to your breast, placing your entire nipple and as much of the colored area around your nipple (areola) as possible into your baby's mouth.   °¨ More areola should be visible above your baby's upper lip than below the lower lip.   °¨ Your baby's tongue should be between his or her lower gum and your breast.   °· Ensure that your baby's mouth is correctly positioned around your nipple (latched). Your baby's lips should create a seal on your breast and be turned out (everted). °· It is common for your baby to suck about 2-3 minutes in order to start the flow of breast milk. °Latching °Teaching your baby how to latch on to your breast properly is very important. An improper latch can cause nipple pain and decreased milk supply for you and poor weight gain in your baby. Also, if your baby is not latched onto your nipple properly, he or she may swallow some air during feeding. This can make your baby fussy. Burping your baby when you switch breasts during the feeding can help to get rid of the air. However, teaching your baby to latch on properly is still the best way to prevent fussiness from swallowing air while breastfeeding. °Signs that your baby has successfully latched on to your nipple:    °· Silent tugging or silent sucking, without causing you pain.   °· Swallowing heard between every 3-4 sucks.   °·  Muscle movement above and in front of his or her ears while sucking.   °Signs that your baby has not successfully latched on to  nipple:  °· Sucking sounds or smacking sounds from your baby while breastfeeding. °· Nipple pain. °If you think your baby has not latched on correctly, slip your finger into the corner of your baby's mouth to break the suction and place it between your baby's gums. Attempt breastfeeding initiation again. °Signs of Successful Breastfeeding °Signs from your baby:   °· A gradual decrease in the number of sucks or complete cessation of sucking.   °· Falling asleep.   °· Relaxation of his or her body.   °· Retention of a small amount of milk in his or her mouth.   °· Letting go   of your breast by himself or herself. °Signs from you: °· Breasts that have increased in firmness, weight, and size 1-3 hours after feeding.   °· Breasts that are softer immediately after breastfeeding. °· Increased milk volume, as well as a change in milk consistency and color by the fifth day of breastfeeding.   °· Nipples that are not sore, cracked, or bleeding. °Signs That Your Baby is Getting Enough Milk °· Wetting at least 3 diapers in a 24-hour period. The urine should be clear and pale yellow by age 5 days. °· At least 3 stools in a 24-hour period by age 5 days. The stool should be soft and yellow. °· At least 3 stools in a 24-hour period by age 7 days. The stool should be seedy and yellow. °· No loss of weight greater than 10% of birth weight during the first 3 days of age. °· Average weight gain of 4-7 ounces (113-198 g) per week after age 4 days. °· Consistent daily weight gain by age 5 days, without weight loss after the age of 2 weeks. °After a feeding, your baby may spit up a small amount. This is common. °BREASTFEEDING FREQUENCY AND DURATION °Frequent feeding will help you make more milk and can prevent sore nipples and breast engorgement. Breastfeed when you feel the need to reduce the fullness of your breasts or when your baby shows signs of hunger. This is called "breastfeeding on demand." Avoid introducing a pacifier to your  baby while you are working to establish breastfeeding (the first 4-6 weeks after your baby is born). After this time you may choose to use a pacifier. Research has shown that pacifier use during the first year of a baby's life decreases the risk of sudden infant death syndrome (SIDS). °Allow your baby to feed on each breast as long as he or she wants. Breastfeed until your baby is finished feeding. When your baby unlatches or falls asleep while feeding from the first breast, offer the second breast. Because newborns are often sleepy in the first few weeks of life, you may need to awaken your baby to get him or her to feed. °Breastfeeding times will vary from baby to baby. However, the following rules can serve as a guide to help you ensure that your baby is properly fed: °· Newborns (babies 4 weeks of age or younger) may breastfeed every 1-3 hours. °· Newborns should not go longer than 3 hours during the day or 5 hours during the night without breastfeeding. °· You should breastfeed your baby a minimum of 8 times in a 24-hour period until you begin to introduce solid foods to your baby at around 6 months of age. °BREAST MILK PUMPING °Pumping and storing breast milk allows you to ensure that your baby is exclusively fed your breast milk, even at times when you are unable to breastfeed. This is especially important if you are going back to work while you are still breastfeeding or when you are not able to be present during feedings. Your lactation consultant can give you guidelines on how long it is safe to store breast milk.  °A breast pump is a machine that allows you to pump milk from your breast into a sterile bottle. The pumped breast milk can then be stored in a refrigerator or freezer. Some breast pumps are operated by hand, while others use electricity. Ask your lactation consultant which type will work best for you. Breast pumps can be purchased, but some hospitals and breastfeeding support groups   lease  breast pumps on a monthly basis. A lactation consultant can teach you how to hand express breast milk, if you prefer not to use a pump.  °CARING FOR YOUR BREASTS WHILE YOU BREASTFEED °Nipples can become dry, cracked, and sore while breastfeeding. The following recommendations can help keep your breasts moisturized and healthy: °· Avoid using soap on your nipples.   °· Wear a supportive bra. Although not required, special nursing bras and tank tops are designed to allow access to your breasts for breastfeeding without taking off your entire bra or top. Avoid wearing underwire-style bras or extremely tight bras. °· Air dry your nipples for 3-4 minutes after each feeding.   °· Use only cotton bra pads to absorb leaked breast milk. Leaking of breast milk between feedings is normal.   °· Use lanolin on your nipples after breastfeeding. Lanolin helps to maintain your skin's normal moisture barrier. If you use pure lanolin, you do not need to wash it off before feeding your baby again. Pure lanolin is not toxic to your baby. You may also hand express a few drops of breast milk and gently massage that milk into your nipples and allow the milk to air dry. °In the first few weeks after giving birth, some women experience extremely full breasts (engorgement). Engorgement can make your breasts feel heavy, warm, and tender to the touch. Engorgement peaks within 3-5 days after you give birth. The following recommendations can help ease engorgement: °· Completely empty your breasts while breastfeeding or pumping. You may want to start by applying warm, moist heat (in the shower or with warm water-soaked hand towels) just before feeding or pumping. This increases circulation and helps the milk flow. If your baby does not completely empty your breasts while breastfeeding, pump any extra milk after he or she is finished. °· Wear a snug bra (nursing or regular) or tank top for 1-2 days to signal your body to slightly decrease milk  production. °· Apply ice packs to your breasts, unless this is too uncomfortable for you. °· Make sure that your baby is latched on and positioned properly while breastfeeding. °If engorgement persists after 48 hours of following these recommendations, contact your health care provider or a lactation consultant. °OVERALL HEALTH CARE RECOMMENDATIONS WHILE BREASTFEEDING °· Eat healthy foods. Alternate between meals and snacks, eating 3 of each per day. Because what you eat affects your breast milk, some of the foods may make your baby more irritable than usual. Avoid eating these foods if you are sure that they are negatively affecting your baby. °· Drink milk, fruit juice, and water to satisfy your thirst (about 10 glasses a day).   °· Rest often, relax, and continue to take your prenatal vitamins to prevent fatigue, stress, and anemia. °· Continue breast self-awareness checks. °· Avoid chewing and smoking tobacco. °· Avoid alcohol and drug use. °Some medicines that may be harmful to your baby can pass through breast milk. It is important to ask your health care provider before taking any medicine, including all over-the-counter and prescription medicine as well as vitamin and herbal supplements. °It is possible to become pregnant while breastfeeding. If birth control is desired, ask your health care provider about options that will be safe for your baby. °SEEK MEDICAL CARE IF:  °· You feel like you want to stop breastfeeding or have become frustrated with breastfeeding. °· You have painful breasts or nipples. °· Your nipples are cracked or bleeding. °· Your breasts are red, tender, or warm. °· You have   a swollen area on either breast. °· You have a fever or chills. °· You have nausea or vomiting. °· You have drainage other than breast milk from your nipples. °· Your breasts do not become full before feedings by the fifth day after you give birth. °· You feel sad and depressed. °· Your baby is too sleepy to eat  well. °· Your baby is having trouble sleeping.   °· Your baby is wetting less than 3 diapers in a 24-hour period. °· Your baby has less than 3 stools in a 24-hour period. °· Your baby's skin or the white part of his or her eyes becomes yellow.   °· Your baby is not gaining weight by 5 days of age. °SEEK IMMEDIATE MEDICAL CARE IF:  °· Your baby is overly tired (lethargic) and does not want to wake up and feed. °· Your baby develops an unexplained fever. °Document Released: 06/07/2005 Document Revised: 06/12/2013 Document Reviewed: 11/29/2012 °ExitCare® Patient Information ©2015 [employer]. This information is not intended to replace advice given to you by your health care provider. Make sure you discuss any questions you have with your health care provider. ° °

## 2014-04-13 NOTE — Lactation Note (Addendum)
This note was copied from the chart of Mary Good Dinges. Lactation Consultation Note  Patient Name: Mary Good Mary XBMWU'XToday's Date: 04/13/2014 Reason for consult: Follow-up assessment Baby 70 hours of life. Mom's breasts are filling. Mom states baby wanting to nurse often, and mom is asking how long to nurse on each side. Discussed hind milk and need to express or pump for comfort. Enc mom to nurse with cues, allowing baby to determine when she is finished. Enc mom to pump for comfort on the opposite breast and to massage firmer areas of breast toward nipple as she pumps. Enc mom to watch baby and nurse with cues, nursing on both sides if baby not satisfied. Discussed breast capacity. Mom able to latch baby deeply, baby suckles rhythmically with intermittent swallows noted. Discussed engorgement prevention/treatment. Mom aware of OP/BFSG and LC phone line services. Referred parents to Baby and Me booklet for number of diapers to expect and EBM storage guidelines.   Maternal Data    Feeding Feeding Type: Breast Fed  LATCH Score/Interventions Latch: Grasps breast easily, tongue down, lips flanged, rhythmical sucking.  Audible Swallowing: Spontaneous and intermittent  Type of Nipple: Everted at rest and after stimulation  Comfort (Breast/Nipple): Filling, red/small blisters or bruises, mild/mod discomfort  Problem noted: Mild/Moderate discomfort  Hold (Positioning): No assistance needed to correctly position infant at breast.  LATCH Score: 9  Lactation Tools Discussed/Used     Consult Status Consult Status: Complete    Mary Good, Mary Good 04/13/2014, 12:02 PM

## 2014-04-22 ENCOUNTER — Encounter (HOSPITAL_COMMUNITY): Payer: Self-pay | Admitting: Obstetrics and Gynecology

## 2015-01-13 ENCOUNTER — Encounter: Payer: Self-pay | Admitting: Gastroenterology

## 2015-08-15 ENCOUNTER — Encounter: Payer: Self-pay | Admitting: Internal Medicine

## 2015-08-15 ENCOUNTER — Ambulatory Visit (INDEPENDENT_AMBULATORY_CARE_PROVIDER_SITE_OTHER): Payer: 59 | Admitting: Internal Medicine

## 2015-08-15 ENCOUNTER — Encounter: Payer: Self-pay | Admitting: Gastroenterology

## 2015-08-15 VITALS — BP 120/80 | HR 68 | Temp 98.5°F | Resp 16 | Ht 64.0 in | Wt 133.0 lb

## 2015-08-15 DIAGNOSIS — B349 Viral infection, unspecified: Secondary | ICD-10-CM | POA: Diagnosis not present

## 2015-08-15 NOTE — Progress Notes (Signed)
   Subjective:    Patient ID: Mary Good, female    DOB: Feb 21, 1981, 35 y.o.   MRN: [id number]  HPI The patient is a 35 YO female coming in for flu-like symptoms. Took the flu shot this year. Symptoms started 2 days ago with some cough and nasal drainage. Mild fever one day. Mild body aches. Her children have been sick with ear infection. No confirmed flu for anyone she knows. Called her Ob who sent in a z-pack which she started yesterday. She is feeling better since then but not 100 % better.   Review of Systems  Constitutional: Positive for chills. Negative for fever, activity change, appetite change, fatigue and unexpected weight change.  HENT: Positive for congestion and rhinorrhea. Negative for ear discharge, ear pain, postnasal drip, sinus pressure, sore throat and trouble swallowing.   Respiratory: Positive for cough. Negative for chest tightness, shortness of breath and wheezing.   Cardiovascular: Negative for chest pain, palpitations and leg swelling.  Gastrointestinal: Negative.   Musculoskeletal: Negative.       Objective:   Physical Exam  Constitutional: She appears well-developed and well-nourished. No distress.  HENT:  Head: Normocephalic and atraumatic.  Both TM normal, erythema with mild redness, clear drainage, no CAD  Eyes: EOM are normal.  Neck: Normal range of motion.  Cardiovascular: Normal rate and regular rhythm.   Pulmonary/Chest: Effort normal and breath sounds normal. No respiratory distress. She has no wheezes. She has no rales.  Abdominal: Soft. She exhibits no distension. There is no tenderness.   Filed Vitals:   08/15/15 1321  BP: 120/80  Pulse: 68  Temp: 98.5 F (36.9 C)  TempSrc: Oral  Resp: 16  Height:  (1.626 m)  Weight: 133 lb (60.328 kg)  SpO2: 98%      Assessment & Plan:

## 2015-08-15 NOTE — Patient Instructions (Signed)
You do not have the flu. You have a respiratory virus. They do not require antibiotics and typically last about 7-10 days. The symptoms are typically the worst on day 4-5.   Get plenty of rest and stay hydrated. Warm liquids and tea with honey are good for a sore throat.   It is okay to take flonase for sinus congestion and drainage.   Upper Respiratory Infection, Adult Most upper respiratory infections (URIs) are a viral infection of the air passages leading to the lungs. A URI affects the nose, throat, and upper air passages. The most common type of URI is nasopharyngitis and is typically referred to as "the common cold." URIs run their course and usually go away on their own. Most of the time, a URI does not require medical attention, but sometimes a bacterial infection in the upper airways can follow a viral infection. This is called a secondary infection. Sinus and middle ear infections are common types of secondary upper respiratory infections. Bacterial pneumonia can also complicate a URI. A URI can worsen asthma and chronic obstructive pulmonary disease (COPD). Sometimes, these complications can require emergency medical care and may be life threatening.  CAUSES Almost all URIs are caused by viruses. A virus is a type of germ and can spread from one person to another.  RISKS FACTORS You may be at risk for a URI if:   You smoke.   You have chronic heart or lung disease.  You have a weakened defense (immune) system.   You are very young or very old.   You have nasal allergies or asthma.  You work in crowded or poorly ventilated areas.  You work in health care facilities or schools. SIGNS AND SYMPTOMS  Symptoms typically develop 2-3 days after you come in contact with a cold virus. Most viral URIs last 7-10 days. However, viral URIs from the influenza virus (flu virus) can last 14-18 days and are typically more severe. Symptoms may include:   Runny or stuffy (congested) nose.    Sneezing.   Cough.   Sore throat.   Headache.   Fatigue.   Fever.   Loss of appetite.   Pain in your forehead, behind your eyes, and over your cheekbones (sinus pain).  Muscle aches.  DIAGNOSIS  Your health care provider may diagnose a URI by:  Physical exam.  Tests to check that your symptoms are not due to another condition such as:  Strep throat.  Sinusitis.  Pneumonia.  Asthma. TREATMENT  A URI goes away on its own with time. It cannot be cured with medicines, but medicines may be prescribed or recommended to relieve symptoms. Medicines may help:  Reduce your fever.  Reduce your cough.  Relieve nasal congestion. HOME CARE INSTRUCTIONS   Take medicines only as directed by your health care provider.   Gargle warm saltwater or take cough drops to comfort your throat as directed by your health care provider.  Use a warm mist humidifier or inhale steam from a shower to increase air moisture. This may make it easier to breathe.  Drink enough fluid to keep your urine clear or pale yellow.   Eat soups and other clear broths and maintain good nutrition.   Rest as needed.   Return to work when your temperature has returned to normal or as your health care provider advises. You may need to stay home longer to avoid infecting others. You can also use a face mask and careful hand washing to prevent spread  of the virus.  Increase the usage of your inhaler if you have asthma.   Do not use any tobacco products, including cigarettes, chewing tobacco, or electronic cigarettes. If you need help quitting, ask your health care provider. PREVENTION  The best way to protect yourself from getting a cold is to practice good hygiene.   Avoid oral or hand contact with people with cold symptoms.   Wash your hands often if contact occurs.  There is no clear evidence that vitamin C, vitamin E, echinacea, or exercise reduces the chance of developing a cold.  However, it is always recommended to get plenty of rest, exercise, and practice good nutrition.  SEEK MEDICAL CARE IF:   You are getting worse rather than better.   Your symptoms are not controlled by medicine.   You have chills.  You have worsening shortness of breath.  You have brown or red mucus.  You have yellow or brown nasal discharge.  You have pain in your face, especially when you bend forward.  You have a fever.  You have swollen neck glands.  You have pain while swallowing.  You have white areas in the back of your throat. SEEK IMMEDIATE MEDICAL CARE IF:   You have severe or persistent:  Headache.  Ear pain.  Sinus pain.  Chest pain.  You have chronic lung disease and any of the following:  Wheezing.  Prolonged cough.  Coughing up blood.  A change in your usual mucus.  You have a stiff neck.  You have changes in your:  Vision.  Hearing.  Thinking.  Mood. MAKE SURE YOU:   Understand these instructions.  Will watch your condition.  Will get help right away if you are not doing well or get worse.   This information is not intended to replace advice given to you by your health care provider. Make sure you discuss any questions you have with your health care provider.   Document Released: 12/01/2000 Document Revised: 10/22/2014 Document Reviewed: 09/12/2013 Elsevier Interactive Patient Education [employer]

## 2015-08-15 NOTE — Progress Notes (Signed)
Pre visit review using our clinic review tool, if applicable. No additional management support is needed unless otherwise documented below in the visit note. 

## 2015-08-15 NOTE — Assessment & Plan Note (Signed)
Does not sound to be flu and she did get immunized. She requests testing so POC influenza done in the office which was negative. Advised that she likely does not need the z-pack and she should not be taking. Advised that her symptoms may still worse as day 4-5 worst for symptoms and course typically 7-10 days. She understands.

## 2015-08-18 LAB — POCT INFLUENZA A/B
INFLUENZA A, POC: NEGATIVE
Influenza B, POC: NEGATIVE

## 2015-08-18 NOTE — Addendum Note (Signed)
Addended by: Conception Chancy R on: 08/18/2015 09:21 AM   Modules accepted: Orders

## 2015-10-08 ENCOUNTER — Ambulatory Visit (INDEPENDENT_AMBULATORY_CARE_PROVIDER_SITE_OTHER): Payer: Commercial Managed Care - HMO | Admitting: Gastroenterology

## 2015-10-08 ENCOUNTER — Encounter: Payer: Self-pay | Admitting: Gastroenterology

## 2015-10-08 VITALS — BP 100/60 | HR 72 | Ht 64.0 in | Wt 134.0 lb

## 2015-10-08 DIAGNOSIS — K649 Unspecified hemorrhoids: Secondary | ICD-10-CM

## 2015-10-08 NOTE — Patient Instructions (Addendum)
Will arrange hemorrhoid banding evaluation/application appointment with Dr. Rhea BeltonPyrtle on 12/17/15 at 3:30 pm.

## 2015-10-08 NOTE — Progress Notes (Signed)
Review of pertinent gastrointestinal problems: 1. Minor rectal bleeding, ?from hemorrhoids 03/2009 Flex sigmoidoscopy Dr. Christella Hartigan found Mild erythema, granularity to distal rectum; biopseies taken to check for chronic inflammation (biopsies without inflammation). Small internal hemorrhoids. Otherwise normal examination.  HPI: This is a   very pleasant 35 year old woman who was self-referred    Chief complaint is bothersome hemorrhoids  I last saw her at the time of flexible sigmoidoscopy about 6-7 years ago. See those results summarized above.  Since then she has had two children,   She feels her hemorrhoid have worsened,  She can feel them protrude on the outside often.  She has no bleeding.  They itch, hard to keep sanitary.  Never really has to push, strain.  She has an easy soft bowel movement every 1 day or 2  Really wants to have them taken care of.  Review of systems: Pertinent positive and negative review of systems were noted in the above HPI section. Complete review of systems was performed and was otherwise normal.   Past Medical History  Diagnosis Date  . Acute maxillary sinusitis     History  . History of chicken pox   . PP care - s/p C/S 2/25 08/17/2011  . Acute blood loss anemia 08/17/2011    history with 2013 pregnancy    Past Surgical History  Procedure Laterality Date  . Wisdom tooth extraction  2004  . Cesarean section  08/16/2011    Procedure: CESAREAN SECTION;  Surgeon: Lenoard Aden, MD;  Location: [facility];  Service: Gynecology;  Laterality: N/A;  . Cesarean section N/A 04/10/2014    Procedure: REPEAT CESAREAN SECTION;  Surgeon: Lenoard Aden, MD;  Location: [facility];  Service: Obstetrics;  Laterality: N/A;  EDD: 04/16/14    Current Outpatient Prescriptions  Medication Sig Dispense Refill  . ORTHO MICRONOR 0.35 MG tablet Take 1 tablet by mouth daily.  0   No current facility-administered medications for this visit.    Allergies as of 10/08/2015 -  Review Complete 10/08/2015  Allergen Reaction Noted  . Penicillins Other (See Comments)   . Hydrocodone Other (See Comments) 12/01/2006    Family History  Problem Relation Age of Onset  . Nephrolithiasis Father   . Heart attack Father   . Diabetes Maternal Grandmother   . Cancer Paternal Grandmother     breast    Social History   Social History  . Marital Status: Married    Spouse Name: N/A  . Number of Children: N/A  . Years of Education: N/A   Occupational History  . Not on file.   Social History Main Topics  . Smoking status: Former Smoker -- 0.50 packs/day for 5 years    Types: Cigarettes    Quit date: 08/12/2002  . Smokeless tobacco: Never Used     Comment: in college  . Alcohol Use: No  . Drug Use: No  . Sexual Activity: Yes    Birth Control/ Protection: None   Other Topics Concern  . Not on file   Social History Narrative     Physical Exam: BP 100/60 mmHg  Pulse 72  Ht  (1.626 m)  Wt 134 lb (60.782 kg)  BMI 22.99 kg/m2  LMP 09/07/2015 (Approximate) Constitutional: generally well-appearing Psychiatric: alert and oriented x3 Eyes: extraocular movements intact Mouth: oral pharynx moist, no lesions Neck: supple no lymphadenopathy Cardiovascular: heart regular rate and rhythm Lungs: clear to auscultation bilaterally Abdomen: soft, nontender, nondistended, no obvious ascites, no peritoneal signs, normal  bowel sounds Extremities: no lower extremity edema bilaterally Skin: no lesions on visible extremities Rectal examination with female assistant in the room: Small amount of deflated hemorrhoidal tissue externally, no anal fissures, anoscopic examination found 1-2 small internal hemorrhoids. Digital rectal exam felt no distal rectal masses, stool is brown and not checked for Hemoccult.  Assessment and plan: 35 y.o. female with  bothersome internal, external anal hemorrhoids  Normally I suggest a trial of fiber supplementation to help regulate  bowel movements however she really never has to strain or push and never sits on the toilet very long going to the bathroom and so I don't think there is much room for manipulation there. I recommended that she visit with one of my partners to consider hemorrhoidal banding therapy. I discussed this with Dr.Pyrtle who is happy to see her and we will arrange for that visit at her soonest convenience.   Rob Buntinganiel Jacobs, MD [facility] 10/08/2015, 10:04 AM  Cc: Zola ButtonLowne Chase, Grayling CongressYvonne R, *

## 2015-12-17 ENCOUNTER — Encounter: Payer: Self-pay | Admitting: Internal Medicine

## 2015-12-17 ENCOUNTER — Ambulatory Visit (INDEPENDENT_AMBULATORY_CARE_PROVIDER_SITE_OTHER): Payer: BLUE CROSS/BLUE SHIELD | Admitting: Internal Medicine

## 2015-12-17 VITALS — BP 114/70 | HR 76 | Ht 63.75 in | Wt 129.1 lb

## 2015-12-17 DIAGNOSIS — K648 Other hemorrhoids: Secondary | ICD-10-CM

## 2015-12-17 NOTE — Progress Notes (Signed)
Patient ID: Mary Good, female   DOB: Jan 22, 1981, 35 y.o.   MRN: [id number]   [name] is a 35 year old female patient of Dr. Christella HartiganJacobs' with symptomatic internal hemorrhoids referred to me by Dr. Christella HartiganJacobs for consideration of hemorrhoidal banding. She reports that she has bothersome prolapsing internal hemorrhoids. This is associated with perianal itching. Not associated with bleeding. She does report trouble cleaning after defecation. These been present since the birth of her second child. At times she feels like hemorrhoidal tissue does not reduce on its own. No change in bowel habit. One bowel movement daily on a regular basis. No melena. Prior flexible sigmoidoscopy by Dr. Christella HartiganJacobs revealed internal hemorrhoids and 2010. There was mild erythema seen in the rectum but no evidence of active or chronic colitis by biopsy. We spent time discussing the risks, benefits and alternatives to banding and she wishes to proceed  PROCEDURE NOTE: The patient presents with symptomatic grade 2 internal  hemorrhoids, requesting rubber band ligation of his/her hemorrhoidal disease.  All risks, benefits and alternative forms of therapy were described and informed consent was obtained.  In the Left Lateral Decubitus position anoscopic examination revealed grade 1-2 internal hemorrhoids in the RA and LL positions.  The anorectum was pre-medicated with 0.125% nitroglycerin ointment The decision was made to band the right anterior internal hemorrhoid, and the [facility] O'Regan System was used to perform band ligation without complication.  Digital anorectal examination was then performed to assure proper positioning of the band, and to adjust the banded tissue as required. About 30 seconds after assessing the band position by digital rectal exam the patient experienced lightheadedness, diaphoresis and presyncopal symptom. This is consistent with vasovagal reaction. She had some very minor pinching and repeat digital rectal exam  was performed and the band was loosened slightly but remained in place. She was observed for approximately 30 minutes recovered fully and denied abdominal pain, pelvic pain, pinching or pulling. She was made aware that I am on call tonight and to call me with any questions or concerns. She voiced understanding  The patient was then discharged home without pain or other issues.  Dietary and behavioral recommendations were given and along with follow-up instructions.    The patient will return as scheduled for  follow-up and possible additional banding as required.

## 2015-12-17 NOTE — Patient Instructions (Addendum)
You have been scheduled for your 2nd hemorrhoidal banding on 02/10/16 @ 4:00 pm.  You are scheduled for your 3rd hemorrhoidal banding on 02/25/16 @ 4:00 pm   HEMORRHOID BANDING PROCEDURE    FOLLOW-UP CARE   1. The procedure you have had should have been relatively painless since the banding of the area involved does not have nerve endings and there is no pain sensation.  The rubber band cuts off the blood supply to the hemorrhoid and the band may fall off as soon as 48 hours after the banding (the band may occasionally be seen in the toilet bowl following a bowel movement). You may notice a temporary feeling of fullness in the rectum which should respond adequately to plain Tylenol or Motrin.  2. Following the banding, avoid strenuous exercise that evening and resume full activity the next day.  A sitz bath (soaking in a warm tub) or bidet is soothing, and can be useful for cleansing the area after bowel movements.     3. To avoid constipation, take two tablespoons of natural wheat bran, natural oat bran, flax, Benefiber or any over the counter fiber supplement and increase your water intake to 7-8 glasses daily.    4. Unless you have been prescribed anorectal medication, do not put anything inside your rectum for two weeks: No suppositories, enemas, fingers, etc.  5. Occasionally, you may have more bleeding than usual after the banding procedure.  This is often from the untreated hemorrhoids rather than the treated one.  Don't be concerned if there is a tablespoon or so of blood.  If there is more blood than this, lie flat with your bottom higher than your head and apply an ice pack to the area. If the bleeding does not stop within a half an hour or if you feel faint, call our office at [phone] or go to the emergency room.  6. Problems are not common; however, if there is a substantial amount of bleeding, severe pain, chills, fever or difficulty passing urine (very rare) or other  problems, you should call us at [phone] or report to the nearest emergency room.  7. Do not stay seated continuously for more than 2-3 hours for a day or two after the procedure.  Tighten your buttock muscles 10-15 times every two hours and take 10-15 deep breaths every 1-2 hours.  Do not spend more than a few minutes on the toilet if you cannot empty your bowel; instead re-visit the toilet at a later time.   CC: Dr Billy Coastaavon

## 2016-01-29 ENCOUNTER — Encounter: Payer: Self-pay | Admitting: *Deleted

## 2016-02-10 ENCOUNTER — Encounter: Payer: BLUE CROSS/BLUE SHIELD | Admitting: Internal Medicine

## 2016-02-12 ENCOUNTER — Telehealth: Payer: Self-pay | Admitting: *Deleted

## 2016-02-12 NOTE — Telephone Encounter (Signed)
No show letter mailed to patient. 

## 2016-02-25 ENCOUNTER — Encounter: Payer: BLUE CROSS/BLUE SHIELD | Admitting: Internal Medicine

## 2016-04-20 DIAGNOSIS — J209 Acute bronchitis, unspecified: Secondary | ICD-10-CM | POA: Diagnosis not present

## 2016-09-30 DIAGNOSIS — Z6823 Body mass index (BMI) 23.0-23.9, adult: Secondary | ICD-10-CM | POA: Diagnosis not present

## 2016-09-30 DIAGNOSIS — Z01419 Encounter for gynecological examination (general) (routine) without abnormal findings: Secondary | ICD-10-CM | POA: Diagnosis not present

## 2016-11-06 ENCOUNTER — Ambulatory Visit (INDEPENDENT_AMBULATORY_CARE_PROVIDER_SITE_OTHER): Payer: BLUE CROSS/BLUE SHIELD | Admitting: Family Medicine

## 2016-11-06 ENCOUNTER — Encounter: Payer: Self-pay | Admitting: Family Medicine

## 2016-11-06 ENCOUNTER — Encounter (INDEPENDENT_AMBULATORY_CARE_PROVIDER_SITE_OTHER): Payer: Self-pay

## 2016-11-06 VITALS — BP 112/68 | HR 85 | Temp 98.4°F | Wt 132.0 lb

## 2016-11-06 DIAGNOSIS — R05 Cough: Secondary | ICD-10-CM

## 2016-11-06 DIAGNOSIS — J40 Bronchitis, not specified as acute or chronic: Secondary | ICD-10-CM

## 2016-11-06 DIAGNOSIS — R059 Cough, unspecified: Secondary | ICD-10-CM

## 2016-11-06 MED ORDER — ALBUTEROL SULFATE HFA 108 (90 BASE) MCG/ACT IN AERS: 2.0000 | INHALATION_SPRAY | Freq: Four times a day (QID) | RESPIRATORY_TRACT | 0 refills | Status: DC | PRN

## 2016-11-06 MED ORDER — AZITHROMYCIN 250 MG PO TABS: ORAL_TABLET | ORAL | 0 refills | Status: DC

## 2016-11-06 MED ORDER — BENZONATATE 100 MG PO CAPS: 100.0000 mg | ORAL_CAPSULE | Freq: Three times a day (TID) | ORAL | 0 refills | Status: DC | PRN

## 2016-11-06 NOTE — Progress Notes (Signed)
[facility] [address], [address] [place], [place] [address] [phone]  Date:  11/06/2016   Name:  Mary Good   DOB:  15-Aug-1980   MRN:  [id number]  PCP:  Myrlene Brokerrawford, Elizabeth A, MD    Chief Complaint: Cough (Cough for several weeks. Sinus pressure. Chest congestion. )   History of Present Illness:  Mary Good is a 36 y.o. very pleasant female patient who presents with the following:  Here today with complaint of acute illness,   She has noted a cough for "a few weeks," it seems to be very persistent.  She is coughing up some dark green material and she feels tired. She is also having some cough "attacks" She also notes body aches, HA She did have a tactile low grade temp yesterday per when her husband felt her head No GI symptoms Her husband felt ill a week ago but he got better She has treid some OTC medications - tylneol, ibuprofen. Sudafed  Her 2 children and husband are well  Patient Active Problem List   Diagnosis Date Noted  . Viral illness 08/15/2015  . Group beta Strep positive 04/11/2014  . Cesarean delivery delivered 04/10/2014  . Postpartum care following cesarean delivery (10/21) 04/10/2014    Past Medical History:  Diagnosis Date  . Acute blood loss anemia 08/17/2011   history with 2013 pregnancy  . Acute maxillary sinusitis    History  . History of chicken pox   . Internal hemorrhoids   . PP care - s/p C/S 2/25 08/17/2011    Past Surgical History:  Procedure Laterality Date  . CESAREAN SECTION  08/16/2011   Procedure: CESAREAN SECTION;  Surgeon: Lenoard Adenichard J Taavon, MD;  Location: [facility];  Service: Gynecology;  Laterality: N/A;  . CESAREAN SECTION N/A 04/10/2014   Procedure: REPEAT CESAREAN SECTION;  Surgeon: Lenoard Adenichard J Taavon, MD;  Location: [facility];  Service: Obstetrics;  Laterality: N/A;  EDD: 04/16/14  . WISDOM TOOTH EXTRACTION  2004    Social History  Substance Use Topics  . Smoking  status: Former Smoker    Packs/day: 0.50    Years: 5.00    Types: Cigarettes    Quit date: 08/12/2002  . Smokeless tobacco: Never Used     Comment: in college  . Alcohol use No    Family History  Problem Relation Age of Onset  . Nephrolithiasis Father   . Heart attack Father   . Diabetes Maternal Grandmother   . Cancer Paternal Grandmother        breast    Allergies  Allergen Reactions  . Penicillins Other (See Comments)    Unknown childhood reaction  . Hydrocodone Other (See Comments)    unknown    Medication list has been reviewed and updated.  Current Outpatient Prescriptions on File Prior to Visit  Medication Sig Dispense Refill  . ORTHO MICRONOR 0.35 MG tablet Take 1 tablet by mouth daily.  0   No current facility-administered medications on file prior to visit.     Review of Systems:  As per HPI- otherwise negative. Some cough attacks  Occasional wheezing when she lays down at night   Physical Examination: Vitals:   11/06/16 1030  BP: 112/68  Pulse: 85  Temp: 98.4 F (36.9 C)   Vitals:   11/06/16 1030  Weight: 132 lb (59.9 kg)   Body mass index is 22.84 kg/m. Ideal Body Weight:    GEN:  WDWN, NAD, Non-toxic, A & O x 3, looks well, coughing in room HEENT: Atraumatic, Normocephalic. Neck supple. No masses, No LAD.  Bilateral TM wnl, oropharynx normal.  PEERL,EOMI.   Ears and Nose: No external deformity. CV: RRR, No M/G/R. No JVD. No thrill. No extra heart sounds. PULM: CTA B, no wheezes, crackles, rhonchi. No retractions. No resp. distress. No accessory muscle use. ABD: S, NT, ND, +BS. No rebound. No HSM. EXTR: No c/c/e NEURO Normal gait.  PSYCH: Normally interactive. Conversant. Not depressed or anxious appearing.  Calm demeanor.  Looks well, normal weight  Assessment and Plan: Bronchitis - Plan: azithromycin (ZITHROMAX) 250 MG tablet, benzonatate (TESSALON) 100 MG capsule, albuterol (PROVENTIL HFA;VENTOLIN HFA) 108 (90 Base) MCG/ACT  inhaler  Cough - Plan: benzonatate (TESSALON) 100 MG capsule  Here today with a persistent cough and illness Treat with azithromycin, tessalon and albuterol as needed for any wheezing  Signed Abbe Amsterdam, MD

## 2016-11-06 NOTE — Patient Instructions (Signed)
We are going to treat you for bronchitis with azithromycin (zapck) antibiotic, and also tessalon perles as needed for cough, and albuterol inhaler as needed for wheezing  If you are not getting better in the next few days or if you sinuses continue to be an issue please contact me or your primary doc

## 2016-11-18 DIAGNOSIS — D1801 Hemangioma of skin and subcutaneous tissue: Secondary | ICD-10-CM | POA: Diagnosis not present

## 2016-11-18 DIAGNOSIS — D225 Melanocytic nevi of trunk: Secondary | ICD-10-CM | POA: Diagnosis not present

## 2016-11-18 DIAGNOSIS — C44319 Basal cell carcinoma of skin of other parts of face: Secondary | ICD-10-CM | POA: Diagnosis not present

## 2016-11-18 DIAGNOSIS — D2372 Other benign neoplasm of skin of left lower limb, including hip: Secondary | ICD-10-CM | POA: Diagnosis not present

## 2016-12-06 DIAGNOSIS — C44319 Basal cell carcinoma of skin of other parts of face: Secondary | ICD-10-CM | POA: Diagnosis not present

## 2017-06-14 DIAGNOSIS — J069 Acute upper respiratory infection, unspecified: Secondary | ICD-10-CM | POA: Diagnosis not present

## 2017-06-14 DIAGNOSIS — J029 Acute pharyngitis, unspecified: Secondary | ICD-10-CM | POA: Diagnosis not present

## 2017-06-16 ENCOUNTER — Ambulatory Visit (INDEPENDENT_AMBULATORY_CARE_PROVIDER_SITE_OTHER): Payer: BLUE CROSS/BLUE SHIELD | Admitting: Internal Medicine

## 2017-06-16 ENCOUNTER — Encounter: Payer: Self-pay | Admitting: Internal Medicine

## 2017-06-16 VITALS — BP 112/80 | HR 81 | Temp 97.6°F | Resp 18 | Wt 134.0 lb

## 2017-06-16 DIAGNOSIS — B349 Viral infection, unspecified: Secondary | ICD-10-CM

## 2017-06-16 DIAGNOSIS — J0100 Acute maxillary sinusitis, unspecified: Secondary | ICD-10-CM

## 2017-06-16 MED ORDER — IPRATROPIUM BROMIDE 0.03 % NA SOLN: 2.0000 | Freq: Two times a day (BID) | NASAL | 0 refills | Status: DC

## 2017-06-16 MED ORDER — DOXYCYCLINE HYCLATE 100 MG PO TABS: 100.0000 mg | ORAL_TABLET | Freq: Two times a day (BID) | ORAL | 0 refills | Status: DC

## 2017-06-16 NOTE — Assessment & Plan Note (Signed)
Some of her symptoms are also consistent with a viral infection and she was diagnosed with possible flu on 12/25 She is taking Tamiflu and I have advised her to complete a full course Continue symptomatic treatment

## 2017-06-16 NOTE — Progress Notes (Signed)
Subjective:    Patient ID: Mary Good, female    DOB: 1981-03-07, 36 y.o.   MRN: [id number]  HPI She is here for an acute visit for cold symptoms.  Her symptoms started 12/24. [facility]  She told them she felt like she had the flu and she was not tested because they did not have any rapid flu tests.  She was prescribed tamiflu.  She is taking it with over-the-counter cold medication, but her symptoms are getting worse.   She is experiencing sweats, but is unsure if she has any fever.  She states nasal congestion, ear pressure, postnasal drip,  sinus pain and pressure, sore throat, chest tightness and pressure, productive cough, body aches, headaches and lightheadedness/dizziness intermittently.  She has taken sudafed, tylenol cold and flu, tylenol, advil and the tamiflu.   Medications and allergies reviewed with patient and updated if appropriate.  Patient Active Problem List   Diagnosis Date Noted  . Viral illness 08/15/2015  . Group beta Strep positive 04/11/2014  . Cesarean delivery delivered 04/10/2014  . Postpartum care following cesarean delivery (10/21) 04/10/2014    No current outpatient medications on file prior to visit.   No current facility-administered medications on file prior to visit.     Past Medical History:  Diagnosis Date  . Acute blood loss anemia 08/17/2011   history with 2013 pregnancy  . Acute maxillary sinusitis    History  . History of chicken pox   . Internal hemorrhoids   . PP care - s/p C/S 2/25 08/17/2011    Past Surgical History:  Procedure Laterality Date  . CESAREAN SECTION  08/16/2011   Procedure: CESAREAN SECTION;  Surgeon: Lenoard Adenichard J Taavon, MD;  Location: [facility];  Service: Gynecology;  Laterality: N/A;  . CESAREAN SECTION N/A 04/10/2014   Procedure: REPEAT CESAREAN SECTION;  Surgeon: Lenoard Adenichard J Taavon, MD;  Location: [facility];  Service: Obstetrics;  Laterality: N/A;  EDD: 04/16/14  . WISDOM TOOTH  EXTRACTION  2004    Social History   Socioeconomic History  . Marital status: Married    Spouse name: None  . Number of children: None  . Years of education: None  . Highest education level: None  Social Needs  . Financial resource strain: None  . Food insecurity - worry: None  . Food insecurity - inability: None  . Transportation needs - medical: None  . Transportation needs - non-medical: None  Occupational History  . None  Tobacco Use  . Smoking status: Former Smoker    Packs/day: 0.50    Years: 5.00    Pack years: 2.50    Types: Cigarettes    Last attempt to quit: 08/12/2002    Years since quitting: 14.8  . Smokeless tobacco: Never Used  . Tobacco comment: in college  Substance and Sexual Activity  . Alcohol use: No  . Drug use: No  . Sexual activity: Yes    Birth control/protection: None  Other Topics Concern  . None  Social History Narrative  . None    Family History  Problem Relation Age of Onset  . Nephrolithiasis Father   . Heart attack Father   . Diabetes Maternal Grandmother   . Cancer Paternal Grandmother        breast    Review of Systems  Constitutional: Positive for diaphoresis. Negative for appetite change, chills and fever (unknown).  HENT: Positive for congestion, ear pain (pressure), postnasal drip, sinus  pressure, sinus pain (escpecially left maxilla region) and sore throat.   Respiratory: Positive for cough (green phlegm) and chest tightness. Negative for shortness of breath and wheezing.   Gastrointestinal: Negative for abdominal pain, diarrhea and nausea.  Musculoskeletal: Positive for myalgias.  Neurological: Positive for dizziness, light-headedness and headaches.       Objective:   Vitals:   06/16/17 1135  BP: 112/80  Pulse: 81  Resp: 18  Temp: 97.6 F (36.4 C)  SpO2: 97%   Filed Weights   06/16/17 1135  Weight: 134 lb (60.8 kg)   Body mass index is 23.18 kg/m.  Wt Readings from Last 3 Encounters:  06/16/17 134 lb  (60.8 kg)  11/06/16 132 lb (59.9 kg)  12/17/15 129 lb 2 oz (58.6 kg)     Physical Exam GENERAL APPEARANCE: Mildly ill appearing, NAD EYES: conjunctiva clear, no icterus HEENT: bilateral tympanic membranes and ear canals normal, left maxillary sinus tenderness with palpation, oropharynx with mild erythema, no thyromegaly, trachea midline, no cervical or supraclavicular lymphadenopathy LUNGS: Clear to auscultation without wheeze or crackles, unlabored breathing, good air entry bilaterally CARDIOVASCULAR: Normal S1,S2 without murmurs, no edema SKIN: warm, dry        Assessment & Plan:   See Problem List for Assessment and Plan of chronic medical problems.

## 2017-06-16 NOTE — Patient Instructions (Signed)
An antibiotic and nasal spray was prescribed.  Complete the Tamiflu.  Your prescription(s) have been submitted to your pharmacy or been printed and provided for you. Please take as directed and contact our office if you believe you are having problem(s) with the medication(s) or have any questions.  If your symptoms worsen or fail to improve, please contact our office for further instruction, or in case of emergency go directly to the emergency room at the closest medical facility.   General Recommendations:    Please drink plenty of fluids.  Get plenty of rest   Sleep in humidified air  Use saline nasal sprays  Netti pot  OTC Medications:  Decongestants - helps relieve congestion   Flonase (generic fluticasone) or Nasacort (generic triamcinolone) - please make sure to use the "cross-over" technique at a 45 degree angle towards the opposite eye as opposed to straight up the nasal passageway.   Sudafed (generic pseudoephedrine - Note this is the one that is available behind the pharmacy counter); Products with phenylephrine (-PE) may also be used but is often not as effective as pseudoephedrine.   If you have HIGH BLOOD PRESSURE - Coricidin HBP; AVOID any product that is -D as this contains pseudoephedrine which may increase your blood pressure.  Afrin (oxymetazoline) every 6-8 hours for up to 3 days.  Allergies - helps relieve runny nose, itchy eyes and sneezing   Claritin (generic loratidine), Allegra (fexofenidine), or Zyrtec (generic cyrterizine) for runny nose. These medications should not cause drowsiness.  Note - Benadryl (generic diphenhydramine) may be used however may cause drowsiness  Cough -   Delsym or Robitussin (generic dextromethorphan)  Expectorants - helps loosen mucus to ease removal   Mucinex (generic guaifenesin) as directed on the package.  Headaches / General Aches   Tylenol (generic acetaminophen) - DO NOT EXCEED 3 grams (3,000  mg) in a 24 hour time period  Advil/Motrin (generic ibuprofen)  Sore Throat -   Salt water gargle   Chloraseptic (generic benzocaine) spray or lozenges / Sucrets (generic dyclonine)

## 2017-06-16 NOTE — Assessment & Plan Note (Signed)
Symptoms consistent with sinus infection We will start doxycycline twice daily times 10 days Atrovent nasal spray Over-the-counter cold medications Call if no improvement

## 2017-06-28 DIAGNOSIS — D2261 Melanocytic nevi of right upper limb, including shoulder: Secondary | ICD-10-CM | POA: Diagnosis not present

## 2017-06-28 DIAGNOSIS — D2262 Melanocytic nevi of left upper limb, including shoulder: Secondary | ICD-10-CM | POA: Diagnosis not present

## 2017-06-28 DIAGNOSIS — D2272 Melanocytic nevi of left lower limb, including hip: Secondary | ICD-10-CM | POA: Diagnosis not present

## 2017-06-28 DIAGNOSIS — D485 Neoplasm of uncertain behavior of skin: Secondary | ICD-10-CM | POA: Diagnosis not present

## 2017-06-28 DIAGNOSIS — D2271 Melanocytic nevi of right lower limb, including hip: Secondary | ICD-10-CM | POA: Diagnosis not present

## 2017-06-28 DIAGNOSIS — D225 Melanocytic nevi of trunk: Secondary | ICD-10-CM | POA: Diagnosis not present

## 2017-06-28 DIAGNOSIS — Z85828 Personal history of other malignant neoplasm of skin: Secondary | ICD-10-CM | POA: Diagnosis not present

## 2017-08-04 ENCOUNTER — Ambulatory Visit: Payer: Self-pay

## 2017-08-04 ENCOUNTER — Other Ambulatory Visit: Payer: Self-pay

## 2017-08-04 MED ORDER — OSELTAMIVIR PHOSPHATE 75 MG PO CAPS: 75.0000 mg | ORAL_CAPSULE | Freq: Every day | ORAL | 0 refills | Status: DC

## 2017-08-04 NOTE — Telephone Encounter (Signed)
Pt. Reports her family has been diagnosed with flu. She has back pain and chills, no fever. No other symptoms. Requesting Tamiflu be called in. Explained to pt. That she needs to be seen and evaluated.Refuses OV. States she wants the medicine.

## 2017-08-04 NOTE — Telephone Encounter (Signed)
Patient informed and resent to correct pharmacy and other pharmacies taken out of chart

## 2017-08-04 NOTE — Telephone Encounter (Signed)
We need to know when symptoms started as tamiflu is only effective within 48 hours of first symptoms.

## 2017-08-04 NOTE — Telephone Encounter (Signed)
Sent in tamiflu, take 1 pill daily for prevention.

## 2017-08-04 NOTE — Telephone Encounter (Signed)
States that she does not have symptoms yet, that it was her husbands doctor suggestion for her to get it since she is taking care of three people with the flu

## 2017-11-09 ENCOUNTER — Telehealth: Payer: Self-pay

## 2017-11-09 ENCOUNTER — Telehealth: Payer: Self-pay | Admitting: Internal Medicine

## 2017-11-09 NOTE — Telephone Encounter (Signed)
Copied from CRM [id number]. Topic: Appointment Scheduling - Scheduling Inquiry for Clinic >> Nov 09, 2017  1:30 PM Eston Mould B wrote: Reason for CRM: Pt wants to come in for blood work,  she states she has been tired lately and wants to make sure everything is normal

## 2017-11-09 NOTE — Telephone Encounter (Signed)
Left message for patient to call back to schedule.  °

## 2017-11-09 NOTE — Telephone Encounter (Signed)
Can you please make patient an appointment. Patient has not seen Dr. Okey Dupre since 2017 so labs cannot be put in without a visit. Thank you

## 2017-11-10 NOTE — Telephone Encounter (Signed)
Please schedule appt for hemorrhoid banding.

## 2017-11-11 NOTE — Telephone Encounter (Signed)
LM on Vmail to call back to schedule hemorrhoid banding appointment

## 2017-11-17 ENCOUNTER — Ambulatory Visit (INDEPENDENT_AMBULATORY_CARE_PROVIDER_SITE_OTHER): Payer: BLUE CROSS/BLUE SHIELD | Admitting: Internal Medicine

## 2017-11-17 ENCOUNTER — Encounter: Payer: Self-pay | Admitting: Internal Medicine

## 2017-11-17 ENCOUNTER — Other Ambulatory Visit (INDEPENDENT_AMBULATORY_CARE_PROVIDER_SITE_OTHER): Payer: BLUE CROSS/BLUE SHIELD

## 2017-11-17 VITALS — BP 100/70 | HR 72 | Temp 98.7°F | Ht 63.75 in | Wt 133.0 lb

## 2017-11-17 DIAGNOSIS — Z0000 Encounter for general adult medical examination without abnormal findings: Secondary | ICD-10-CM | POA: Insufficient documentation

## 2017-11-17 LAB — COMPREHENSIVE METABOLIC PANEL
ALBUMIN: 4.5 g/dL (ref 3.5–5.2)
ALT: 10 U/L (ref 0–35)
AST: 11 U/L (ref 0–37)
Alkaline Phosphatase: 44 U/L (ref 39–117)
BUN: 12 mg/dL (ref 6–23)
CALCIUM: 9.3 mg/dL (ref 8.4–10.5)
CHLORIDE: 105 meq/L (ref 96–112)
CO2: 28 mEq/L (ref 19–32)
CREATININE: 0.79 mg/dL (ref 0.40–1.20)
GFR: 87.27 mL/min (ref >60.00)
Glucose, Bld: 106 mg/dL — ABNORMAL HIGH (ref 70–99)
Potassium: 4.1 mEq/L (ref 3.5–5.1)
Sodium: 140 mEq/L (ref 135–145)
Total Bilirubin: 1.4 mg/dL — ABNORMAL HIGH (ref 0.2–1.2)
Total Protein: 7 g/dL (ref 6.0–8.3)

## 2017-11-17 LAB — CBC
HEMATOCRIT: 41.3 % (ref 36.0–46.0)
HEMOGLOBIN: 14 g/dL (ref 12.0–15.0)
MCHC: 33.9 g/dL (ref 30.0–36.0)
MCV: 91.5 fl (ref 78.0–100.0)
PLATELETS: 267 10*3/uL (ref 150.0–400.0)
RBC: 4.52 Mil/uL (ref 3.87–5.11)
RDW: 12.3 % (ref 11.5–15.5)
WBC: 5.2 10*3/uL (ref 4.0–10.5)

## 2017-11-17 LAB — VITAMIN D 25 HYDROXY (VIT D DEFICIENCY, FRACTURES): VITD: 28.17 ng/mL — ABNORMAL LOW (ref 30.00–100.00)

## 2017-11-17 LAB — TSH: TSH: 1.14 u[IU]/mL (ref 0.35–4.50)

## 2017-11-17 LAB — LIPID PANEL
Cholesterol: 142 mg/dL (ref 0–200)
HDL: 59.2 mg/dL (ref >39.00)
LDL CALC: 72 mg/dL (ref 0–99)
NonHDL: 83.01
TRIGLYCERIDES: 54 mg/dL (ref 0.0–149.0)
Total CHOL/HDL Ratio: 2
VLDL: 10.8 mg/dL (ref 0.0–40.0)

## 2017-11-17 LAB — HEMOGLOBIN A1C: Hgb A1c MFr Bld: 5.1 % (ref 4.6–6.5)

## 2017-11-17 LAB — FERRITIN: Ferritin: 31.3 ng/mL (ref 10.0–291.0)

## 2017-11-17 LAB — VITAMIN B12: Vitamin B-12: 305 pg/mL (ref 211–911)

## 2017-11-17 NOTE — Assessment & Plan Note (Signed)
Checking labs. Pap smear up to date with gyn. Flu shot yearly. Tetanus up to date. Counseled about sun safety and dangers of distracted driving. Given screening recommendations.

## 2017-11-17 NOTE — Progress Notes (Signed)
   Subjective:    Patient ID: Mary Good, female    DOB: 1980-10-14, 37 y.o.   MRN: [id number]  HPI The patient is a 37 YO female coming in for physical. Feeling more tired lately.   PMH, [facility], social history reviewed and updated.   Review of Systems  Constitutional: Positive for fatigue. Negative for activity change, appetite change and unexpected weight change.  HENT: Negative.   Eyes: Negative.   Respiratory: Negative for cough, chest tightness and shortness of breath.   Cardiovascular: Negative for chest pain, palpitations and leg swelling.  Gastrointestinal: Negative for abdominal distention, abdominal pain, constipation, diarrhea, nausea and vomiting.  Musculoskeletal: Negative.   Skin: Negative.   Neurological: Negative.   Psychiatric/Behavioral: Negative.       Objective:   Physical Exam  Constitutional: She is oriented to person, place, and time. She appears well-developed and well-nourished.  HENT:  Head: Normocephalic and atraumatic.  Eyes: EOM are normal.  Neck: Normal range of motion.  Cardiovascular: Normal rate and regular rhythm.  Pulmonary/Chest: Effort normal and breath sounds normal. No respiratory distress. She has no wheezes. She has no rales.  Abdominal: Soft. Bowel sounds are normal. She exhibits no distension. There is no tenderness. There is no rebound.  Musculoskeletal: She exhibits no edema.  Neurological: She is alert and oriented to person, place, and time. Coordination normal.  Skin: Skin is warm and dry.  Psychiatric: She has a normal mood and affect.   Vitals:   11/17/17 0852  BP: 100/70  Pulse: 72  Temp: 98.7 F (37.1 C)  TempSrc: Oral  SpO2: 98%  Weight: 133 lb (60.3 kg)  Height: 5' 3.75" (1.619 m)      Assessment & Plan:

## 2017-11-17 NOTE — Patient Instructions (Signed)

## 2017-12-15 ENCOUNTER — Ambulatory Visit (INDEPENDENT_AMBULATORY_CARE_PROVIDER_SITE_OTHER): Payer: BLUE CROSS/BLUE SHIELD | Admitting: Internal Medicine

## 2017-12-15 ENCOUNTER — Encounter: Payer: Self-pay | Admitting: Internal Medicine

## 2017-12-15 VITALS — BP 100/68 | HR 77 | Temp 98.4°F | Ht 63.75 in | Wt 135.0 lb

## 2017-12-15 DIAGNOSIS — J039 Acute tonsillitis, unspecified: Secondary | ICD-10-CM | POA: Diagnosis not present

## 2017-12-15 MED ORDER — AZITHROMYCIN 250 MG PO TABS: ORAL_TABLET | ORAL | 0 refills | Status: DC

## 2017-12-15 NOTE — Assessment & Plan Note (Signed)
CENTOR criteria met for treatment. Azithromycin done due to pcn allergy.

## 2017-12-15 NOTE — Progress Notes (Signed)
   Subjective:    Patient ID: Mary DuosLaura S Good, female    DOB: Aug 26, 1980, 37 y.o.   MRN: [id number]  HPI The patient is a 37 YO female coming in for sore throat. She has a history of strep throat several times and almost got tonsils removed but was pregnant at the time and the surgeon would not do this. Started about 2-3 days ago. Sore throat. Denies cough. Some swelling and redness and drainage from tonsils. She is having mild sinus pressure and congestion. Some swelling and soreness in her neck as well. Overall worsening. Taking tylenol and ibuprofen for pain with reasonable relief.   Review of Systems  Constitutional: Positive for appetite change and chills. Negative for activity change, fatigue, fever and unexpected weight change.  HENT: Positive for congestion, ear pain, sinus pressure and sore throat. Negative for ear discharge, postnasal drip, rhinorrhea, sinus pain, sneezing, tinnitus, trouble swallowing and voice change.   Eyes: Negative.   Respiratory: Negative for cough, chest tightness, shortness of breath and wheezing.   Cardiovascular: Negative.   Gastrointestinal: Negative.   Neurological: Negative.       Objective:   Physical Exam  Constitutional: She is oriented to person, place, and time. She appears well-developed and well-nourished.  HENT:  Head: Normocephalic and atraumatic.  Mouth/Throat: Oropharyngeal exudate, posterior oropharyngeal edema and posterior oropharyngeal erythema present. Tonsillar exudate.  White plaque on the tonsils right greater than left.   Eyes: EOM are normal.  Neck: Normal range of motion.  Cervical adenopathy present  Cardiovascular: Normal rate and regular rhythm.  Pulmonary/Chest: Effort normal and breath sounds normal. No respiratory distress. She has no wheezes. She has no rales.  Abdominal: Soft. Bowel sounds are normal. She exhibits no distension. There is no tenderness. There is no rebound.  Musculoskeletal: She exhibits no edema.    Neurological: She is alert and oriented to person, place, and time. Coordination normal.  Skin: Skin is warm and dry.   Vitals:   12/15/17 1100  BP: 100/68  Pulse: 77  Temp: 98.4 F (36.9 C)  TempSrc: Oral  SpO2: 98%  Weight: 135 lb (61.2 kg)  Height: 5' 3.75" (1.619 m)      Assessment & Plan:

## 2017-12-15 NOTE — Patient Instructions (Signed)
We have sent in azithromycin. Take 2 pills today, then starting tomorrow take 1 pill daily until it's gone.   Try taking zyrtec to help the drainage get better quicker.

## 2018-01-04 DIAGNOSIS — M542 Cervicalgia: Secondary | ICD-10-CM | POA: Diagnosis not present

## 2018-01-04 DIAGNOSIS — M62838 Other muscle spasm: Secondary | ICD-10-CM | POA: Diagnosis not present

## 2018-01-04 DIAGNOSIS — M9905 Segmental and somatic dysfunction of pelvic region: Secondary | ICD-10-CM | POA: Diagnosis not present

## 2018-01-04 DIAGNOSIS — M9901 Segmental and somatic dysfunction of cervical region: Secondary | ICD-10-CM | POA: Diagnosis not present

## 2018-06-28 DIAGNOSIS — L570 Actinic keratosis: Secondary | ICD-10-CM | POA: Diagnosis not present

## 2018-06-28 DIAGNOSIS — D2262 Melanocytic nevi of left upper limb, including shoulder: Secondary | ICD-10-CM | POA: Diagnosis not present

## 2018-06-28 DIAGNOSIS — D2261 Melanocytic nevi of right upper limb, including shoulder: Secondary | ICD-10-CM | POA: Diagnosis not present

## 2018-06-28 DIAGNOSIS — Z85828 Personal history of other malignant neoplasm of skin: Secondary | ICD-10-CM | POA: Diagnosis not present

## 2018-06-28 DIAGNOSIS — D225 Melanocytic nevi of trunk: Secondary | ICD-10-CM | POA: Diagnosis not present

## 2018-07-20 ENCOUNTER — Telehealth: Payer: Self-pay | Admitting: Internal Medicine

## 2018-07-20 NOTE — Telephone Encounter (Signed)
Pt called back in stating that she is starting the same symptoms up and wants to know if she can be sched for the banding again

## 2018-07-20 NOTE — Telephone Encounter (Signed)
Please schedule pt with Dr. Rhea Belton for banding appt.

## 2018-07-20 NOTE — Telephone Encounter (Signed)
Advised the pt that I was able to get approval to sched her for hemorrhoidal banding (per Selinda Michaelslinda Hunt)

## 2018-07-31 ENCOUNTER — Encounter: Payer: BLUE CROSS/BLUE SHIELD | Admitting: Internal Medicine

## 2018-08-02 DIAGNOSIS — H5203 Hypermetropia, bilateral: Secondary | ICD-10-CM | POA: Diagnosis not present

## 2018-08-02 DIAGNOSIS — R51 Headache: Secondary | ICD-10-CM | POA: Diagnosis not present

## 2018-08-21 ENCOUNTER — Ambulatory Visit (INDEPENDENT_AMBULATORY_CARE_PROVIDER_SITE_OTHER): Payer: BLUE CROSS/BLUE SHIELD | Admitting: Internal Medicine

## 2018-08-21 ENCOUNTER — Encounter: Payer: Self-pay | Admitting: Internal Medicine

## 2018-08-21 VITALS — BP 114/78 | HR 73 | Ht 64.0 in | Wt 133.0 lb

## 2018-08-21 DIAGNOSIS — K648 Other hemorrhoids: Secondary | ICD-10-CM

## 2018-08-21 NOTE — Patient Instructions (Signed)
You have been scheduled for an appointment with ____________ at [facility]. Your appointment is on ______________ at ___________. Please arrive at _______________ for registration. Make certain to bring a list of current medications, including any over the counter medications or vitamins. Also bring your co-pay if you have one as well as your insurance cards. [facility] is located at [address]. Should you need to reschedule your appointment, please contact them at [phone].  If you are age 7 or older, your body mass index should be between 23-30. Your Body mass index is 22.83 kg/m. If this is out of the aforementioned range listed, please consider follow up with your Primary Care Provider.  If you are age 72 or younger, your body mass index should be between 19-25. Your Body mass index is 22.83 kg/m. If this is out of the aformentioned range listed, please consider follow up with your Primary Care Provider.

## 2018-08-22 NOTE — Progress Notes (Signed)
Patient ID: Mary Good, female   DOB: August 12, 1980, 37 y.o.   MRN: [id number] HPI: Mary Good is a 38 year old female with a history of internal hemorrhoids who returns for follow-up.  I saw her on 12/17/2015 for consideration of hemorrhoidal banding.  Band was placed to the right anterior internal hemorrhoid however she developed a vagal response and also had some pain and pinching sensation requiring the band to be loosened.  She did not follow-up thereafter.  Today she reports that she tolerated the banding very poorly and does not wish to undergo repeat hemorrhoidal banding.  She continues to have aggravation and annoyance from what she feels to be hemorrhoidal symptoms.  She has discomfort after bowel movement and feels prolapsing tissue after bowel movement.  It is also hard for her to clean easily after bowel movement.  No bleeding.  No pain with passing stool.  Bowel movements are every other day and have not changed.  Stools are not hard and remain formed without blood or melena.  No diarrhea or constipation.  She has tried aggressive fiber supplementation without benefit.  Symptoms worsened after pregnancy.  She has 2 daughters age 57 and 75.  She is interested in more definitive hemorrhoidal treatment.  Past Medical History:  Diagnosis Date  . Acute blood loss anemia 08/17/2011   history with 2013 pregnancy  . Acute maxillary sinusitis    History  . History of chicken pox   . Internal hemorrhoids   . PP care - s/p C/S 2/25 08/17/2011    Past Surgical History:  Procedure Laterality Date  . CESAREAN SECTION  08/16/2011   Procedure: CESAREAN SECTION;  Surgeon: Lenoard Aden, MD;  Location: [facility];  Service: Gynecology;  Laterality: N/A;  . CESAREAN SECTION N/A 04/10/2014   Procedure: REPEAT CESAREAN SECTION;  Surgeon: Lenoard Aden, MD;  Location: [facility];  Service: Obstetrics;  Laterality: N/A;  EDD: 04/16/14  . WISDOM TOOTH EXTRACTION  2004    Outpatient Medications Prior to  Visit  Medication Sig Dispense Refill  . azithromycin (ZITHROMAX) 250 MG tablet Day 1 take 2 pills, days 2-5 take 1 pill daily. 6 tablet 0   No facility-administered medications prior to visit.     Allergies  Allergen Reactions  . Penicillins Other (See Comments)    Unknown childhood reaction  . Hydrocodone Other (See Comments)    unknown    Family History  Problem Relation Age of Onset  . Nephrolithiasis Father   . Heart attack Father   . Diabetes Maternal Grandmother   . Cancer Paternal Grandmother        breast  . Colon cancer Neg Hx   . Esophageal cancer Neg Hx     Social History   Tobacco Use  . Smoking status: Former Smoker    Packs/day: 0.50    Years: 5.00    Pack years: 2.50    Types: Cigarettes    Last attempt to quit: 08/12/2002    Years since quitting: 16.0  . Smokeless tobacco: Never Used  . Tobacco comment: in college  Substance Use Topics  . Alcohol use: No  . Drug use: No    ROS: As per history of present illness, otherwise negative  BP 114/78   Pulse 73   Ht 5\' 4"  (1.626 m)   Wt 133 lb (60.3 kg)   BMI 22.83 kg/m  Constitutional: Well-developed and well-nourished. No distress.   RELEVANT LABS AND IMAGING: CBC    Component Value Date/Time  WBC 5.2 11/17/2017 0915   RBC 4.52 11/17/2017 0915   HGB 14.0 11/17/2017 0915   HCT 41.3 11/17/2017 0915   PLT 267.0 11/17/2017 0915   MCV 91.5 11/17/2017 0915   MCH 32.5 04/11/2014 0555   MCHC 33.9 11/17/2017 0915   RDW 12.3 11/17/2017 0915   MONOABS 0.6 01/29/2008 1357   EOSABS 0.1 01/29/2008 1357   BASOSABS 0.0 01/29/2008 1357    CMP     Component Value Date/Time   NA 140 11/17/2017 0915   K 4.1 11/17/2017 0915   CL 105 11/17/2017 0915   CO2 28 11/17/2017 0915   GLUCOSE 106 (H) 11/17/2017 0915   BUN 12 11/17/2017 0915   CREATININE 0.79 11/17/2017 0915   CALCIUM 9.3 11/17/2017 0915   PROT 7.0 11/17/2017 0915   ALBUMIN 4.5 11/17/2017 0915   AST 11 11/17/2017 0915   ALT 10  11/17/2017 0915   ALKPHOS 44 11/17/2017 0915   BILITOT 1.4 (H) 11/17/2017 0915   GFRNONAA 108 05/17/2007 0930   GFRAA 130 05/17/2007 0930    ASSESSMENT/PLAN: 38 year old female with a history of internal hemorrhoids who returns for follow-up.  I saw her on 12/17/2015 for consideration of hemorrhoidal banding.  1.  Symptomatic internal hemorrhoids --we spent time today discussing her hemorrhoidal symptoms.  She did not tolerate hemorrhoidal banding.  She is interested in more definitive surgical hemorrhoidectomy.  We discussed this at length today.  I recommend that she have a consultation with colorectal surgery to discuss hemorrhoidal surgery.  She has not responded to medical therapy including fiber and over-the-counter hemorrhoidal creams.  I would very much appreciate surgical opinion and if her surgeon does not feel hemorrhoidectomy would benefit her symptoms or her symptoms were not caused by her hemorrhoids then we could consider anorectal manometry.  25 minutes spent with the patient today. Greater than 50% was spent in counseling and coordination of care with the patient    PQ:[id number], Austin Miles, Md [address] [place], [place] [address]

## 2018-08-23 ENCOUNTER — Telehealth: Payer: Self-pay | Admitting: *Deleted

## 2018-08-23 NOTE — Telephone Encounter (Signed)
18 pages of information faxed to [facility] to refer patient for hemorrhoids not amendable to banding. Preference: Dr C.White or Dr A.Thomas. We will await appointment information and will then be in touch with patient.

## 2018-08-24 ENCOUNTER — Ambulatory Visit (INDEPENDENT_AMBULATORY_CARE_PROVIDER_SITE_OTHER): Payer: BLUE CROSS/BLUE SHIELD | Admitting: Internal Medicine

## 2018-08-24 ENCOUNTER — Encounter: Payer: Self-pay | Admitting: Internal Medicine

## 2018-08-24 VITALS — BP 108/62 | HR 84 | Temp 98.1°F | Ht 64.0 in | Wt 132.0 lb

## 2018-08-24 DIAGNOSIS — R6889 Other general symptoms and signs: Secondary | ICD-10-CM | POA: Diagnosis not present

## 2018-08-24 LAB — POC INFLUENZA A&B (BINAX/QUICKVUE)
INFLUENZA A, POC: NEGATIVE
Influenza B, POC: NEGATIVE

## 2018-08-24 MED ORDER — FLUTICASONE PROPIONATE 50 MCG/ACT NA SUSP: 2.0000 | Freq: Every day | NASAL | 6 refills | Status: AC

## 2018-08-24 NOTE — Progress Notes (Signed)
   Subjective:   Patient ID: Mary Good, female    DOB: September 16, 1980, 38 y.o.   MRN: [id number]  HPI The patient is a 38 y.o. female coming in for cold symptoms. Started 2 days ago. Main symptoms are: headaches, fevers and chills, nasal drainage, ear pain. Denies SOB. Overall it is worsening. Has tried tylenol for headache and this is not helping enough. Exposed to sick contacts.   Review of Systems  Constitutional: Positive for activity change, appetite change and chills. Negative for fatigue, fever and unexpected weight change.  HENT: Positive for congestion, postnasal drip, rhinorrhea and sinus pressure. Negative for ear discharge, ear pain, sinus pain, sneezing, sore throat, tinnitus, trouble swallowing and voice change.   Eyes: Negative.   Respiratory: Positive for cough. Negative for chest tightness, shortness of breath and wheezing.   Cardiovascular: Negative.   Gastrointestinal: Negative.   Musculoskeletal: Positive for myalgias.  Neurological: Positive for headaches.    Objective:  Physical Exam Constitutional:      Appearance: She is well-developed.  HENT:     Head: Normocephalic and atraumatic.     Comments: Oropharynx with redness and clear drainage, nose with swollen turbinates, TMs normal bilaterally.  Neck:     Musculoskeletal: Normal range of motion.     Thyroid: No thyromegaly.  Cardiovascular:     Rate and Rhythm: Normal rate and regular rhythm.  Pulmonary:     Effort: Pulmonary effort is normal. No respiratory distress.     Breath sounds: Normal breath sounds. No wheezing or rales.  Abdominal:     Palpations: Abdomen is soft.  Musculoskeletal:        General: Tenderness present.  Lymphadenopathy:     Cervical: No cervical adenopathy.  Skin:    General: Skin is warm and dry.  Neurological:     Mental Status: She is alert and oriented to person, place, and time.     Vitals:   08/24/18 0950  BP: 108/62  Pulse: 84  Temp: 98.1 F (36.7 C)  TempSrc:  Oral  SpO2: 97%  Weight: 132 lb (59.9 kg)  Height: 5\' 4"  (1.626 m)    Assessment & Plan:

## 2018-08-24 NOTE — Patient Instructions (Addendum)
You can start taking flonase or zyrtec at home to help.   The flu test today is negative.

## 2018-08-24 NOTE — Assessment & Plan Note (Signed)
Flu testing done in office as inside window for treatment. This is negative. Given supportive care information and rx for flonase. No indication for antibiotics or steroids at this time.

## 2018-08-28 NOTE — Telephone Encounter (Signed)
I have spoken to [facility] @ [facility] who indicates that patient is scheduled to see Dr A.Thomas on 09/18/2018 at 1:40 pm. She also states that she has spoken to patient and advised her of her appointment.

## 2018-10-17 DIAGNOSIS — K6289 Other specified diseases of anus and rectum: Secondary | ICD-10-CM | POA: Diagnosis not present

## 2019-04-04 ENCOUNTER — Ambulatory Visit (INDEPENDENT_AMBULATORY_CARE_PROVIDER_SITE_OTHER): Payer: BC Managed Care – PPO | Admitting: Internal Medicine

## 2019-04-04 ENCOUNTER — Encounter: Payer: Self-pay | Admitting: Internal Medicine

## 2019-04-04 ENCOUNTER — Other Ambulatory Visit: Payer: Self-pay

## 2019-04-04 VITALS — BP 108/80 | HR 77 | Temp 98.2°F | Ht 64.0 in | Wt 131.0 lb

## 2019-04-04 DIAGNOSIS — Z30011 Encounter for initial prescription of contraceptive pills: Secondary | ICD-10-CM

## 2019-04-04 DIAGNOSIS — R300 Dysuria: Secondary | ICD-10-CM | POA: Diagnosis not present

## 2019-04-04 DIAGNOSIS — Z23 Encounter for immunization: Secondary | ICD-10-CM | POA: Diagnosis not present

## 2019-04-04 LAB — POCT URINALYSIS DIPSTICK
Bilirubin, UA: NEGATIVE
Glucose, UA: NEGATIVE
Nitrite, UA: NEGATIVE
Protein, UA: NEGATIVE
Spec Grav, UA: 1.015 (ref 1.010–1.025)
Urobilinogen, UA: 0.2 E.U./dL
pH, UA: 6.5 (ref 5.0–8.0)

## 2019-04-04 LAB — POCT URINE PREGNANCY: Preg Test, Ur: NEGATIVE

## 2019-04-04 MED ORDER — NORETHIN ACE-ETH ESTRAD-FE 1-20 MG-MCG PO TABS: 1.0000 | ORAL_TABLET | Freq: Every day | ORAL | 11 refills | Status: AC

## 2019-04-04 MED ORDER — NITROFURANTOIN MONOHYD MACRO 100 MG PO CAPS: 100.0000 mg | ORAL_CAPSULE | Freq: Two times a day (BID) | ORAL | 0 refills | Status: AC

## 2019-04-04 MED ORDER — CYCLOBENZAPRINE HCL 5 MG PO TABS: 5.0000 mg | ORAL_TABLET | Freq: Three times a day (TID) | ORAL | 1 refills | Status: AC | PRN

## 2019-04-04 NOTE — Progress Notes (Signed)
   Subjective:   Patient ID: Mary Good, female    DOB: 1980/10/18, 38 y.o.   MRN: [id number]  HPI The patient is a 38 YO female coming in for concerns about potential UTI (some burning with urination for about 3 days, feeling like she has to go all the time, does have stomach cramping from period, denies nausea or vomiting, no back pain, no fevers or chills) and need for birth control (having a lot of cramping with periods and mood disturbance, has not been to gyn in some time, no birth control currently, currently on period and denies concerns for pregnancy).   Review of Systems  Constitutional: Negative.   Respiratory: Negative.   Cardiovascular: Negative.   Gastrointestinal: Positive for abdominal pain. Negative for abdominal distention, constipation, diarrhea, nausea and vomiting.  Genitourinary: Positive for dysuria, frequency, menstrual problem and urgency.  Musculoskeletal: Negative.   Skin: Negative.     Objective:  Physical Exam Constitutional:      Appearance: She is well-developed.  HENT:     Head: Normocephalic and atraumatic.  Neck:     Musculoskeletal: Normal range of motion.  Cardiovascular:     Rate and Rhythm: Normal rate and regular rhythm.  Pulmonary:     Effort: Pulmonary effort is normal. No respiratory distress.     Breath sounds: Normal breath sounds. No wheezing or rales.  Abdominal:     General: Bowel sounds are normal. There is no distension.     Palpations: Abdomen is soft.     Tenderness: There is no abdominal tenderness. There is no rebound.  Skin:    General: Skin is warm and dry.  Neurological:     Mental Status: She is alert and oriented to person, place, and time.     Coordination: Coordination normal.     Vitals:   04/04/19 1059  BP: 108/80  Pulse: 77  Temp: 98.2 F (36.8 C)  TempSrc: Oral  SpO2: 97%  Weight: 131 lb (59.4 kg)  Height: 5\' 4"  (1.626 m)    Assessment & Plan:

## 2019-04-04 NOTE — Patient Instructions (Signed)
We have sent in the birth control pills to start taking daily.   We have sent in macrobid for the infection to take 1 pill twice a day for 1 week.    Contraception Choices Contraception, also called birth control, refers to methods or devices that prevent pregnancy. Hormonal methods Contraceptive implant  A contraceptive implant is a thin, plastic tube that contains a hormone. It is inserted into the upper part of the arm. It can remain in place for up to 3 years. Progestin-only injections Progestin-only injections are injections of progestin, a synthetic form of the hormone progesterone. They are given every 3 months by a health care provider. Birth control pills  Birth control pills are pills that contain hormones that prevent pregnancy. They must be taken once a day, preferably at the same time each day. Birth control patch  The birth control patch contains hormones that prevent pregnancy. It is placed on the skin and must be changed once a week for three weeks and removed on the fourth week. A prescription is needed to use this method of contraception. Vaginal ring  A vaginal ring contains hormones that prevent pregnancy. It is placed in the vagina for three weeks and removed on the fourth week. After that, the process is repeated with a new ring. A prescription is needed to use this method of contraception. Emergency contraceptive Emergency contraceptives prevent pregnancy after unprotected sex. They come in pill form and can be taken up to 5 days after sex. They work best the sooner they are taken after having sex. Most emergency contraceptives are available without a prescription. This method should not be used as your only form of birth control. Barrier methods Female condom  A female condom is a thin sheath that is worn over the penis during sex. Condoms keep sperm from going inside a woman's body. They can be used with a spermicide to increase their effectiveness. They should be  disposed after a single use. Female condom  A female condom is a soft, loose-fitting sheath that is put into the vagina before sex. The condom keeps sperm from going inside a woman's body. They should be disposed after a single use. Diaphragm  A diaphragm is a soft, dome-shaped barrier. It is inserted into the vagina before sex, along with a spermicide. The diaphragm blocks sperm from entering the uterus, and the spermicide kills sperm. A diaphragm should be left in the vagina for 6-8 hours after sex and removed within 24 hours. A diaphragm is prescribed and fitted by a health care provider. A diaphragm should be replaced every 1-2 years, after giving birth, after gaining more than 15 lb (6.8 kg), and after pelvic surgery. Cervical cap  A cervical cap is a round, soft latex or plastic cup that fits over the cervix. It is inserted into the vagina before sex, along with spermicide. It blocks sperm from entering the uterus. The cap should be left in place for 6-8 hours after sex and removed within 48 hours. A cervical cap must be prescribed and fitted by a health care provider. It should be replaced every 2 years. Sponge  A sponge is a soft, circular piece of polyurethane foam with spermicide on it. The sponge helps block sperm from entering the uterus, and the spermicide kills sperm. To use it, you make it wet and then insert it into the vagina. It should be inserted before sex, left in for at least 6 hours after sex, and removed and thrown away within  30 hours. Spermicides Spermicides are chemicals that kill or block sperm from entering the cervix and uterus. They can come as a cream, jelly, suppository, foam, or tablet. A spermicide should be inserted into the vagina with an applicator at least 10-15 minutes before sex to allow time for it to work. The process must be repeated every time you have sex. Spermicides do not require a prescription. Intrauterine contraception Intrauterine device (IUD) An  IUD is a T-shaped device that is put in a woman's uterus. There are two types:  Hormone IUD.This type contains progestin, a synthetic form of the hormone progesterone. This type can stay in place for 3-5 years.  Copper IUD.This type is wrapped in copper wire. It can stay in place for 10 years.  Permanent methods of contraception Female tubal ligation In this method, a woman's fallopian tubes are sealed, tied, or blocked during surgery to prevent eggs from traveling to the uterus. Hysteroscopic sterilization In this method, a small, flexible insert is placed into each fallopian tube. The inserts cause scar tissue to form in the fallopian tubes and block them, so sperm cannot reach an egg. The procedure takes about 3 months to be effective. Another form of birth control must be used during those 3 months. Female sterilization This is a procedure to tie off the tubes that carry sperm (vasectomy). After the procedure, the man can still ejaculate fluid (semen). Natural planning methods Natural family planning In this method, a couple does not have sex on days when the woman could become pregnant. Calendar method This means keeping track of the length of each menstrual cycle, identifying the days when pregnancy can happen, and not having sex on those days. Ovulation method In this method, a couple avoids sex during ovulation. Symptothermal method This method involves not having sex during ovulation. The woman typically checks for ovulation by watching changes in her temperature and in the consistency of cervical mucus. Post-ovulation method In this method, a couple waits to have sex until after ovulation. Summary  Contraception, also called birth control, means methods or devices that prevent pregnancy.  Hormonal methods of contraception include implants, injections, pills, patches, vaginal rings, and emergency contraceptives.  Barrier methods of contraception can include female condoms, female  condoms, diaphragms, cervical caps, sponges, and spermicides.  There are two types of IUDs (intrauterine devices). An IUD can be put in a woman's uterus to prevent pregnancy for 3-5 years.  Permanent sterilization can be done through a procedure for males, females, or both.  Natural family planning methods involve not having sex on days when the woman could become pregnant. This information is not intended to replace advice given to you by your health care provider. Make sure you discuss any questions you have with your health care provider. Document Released: 06/07/2005 Document Revised: 06/09/2017 Document Reviewed: 07/10/2016 Elsevier Patient Education  2020 [employer].

## 2019-04-05 DIAGNOSIS — R300 Dysuria: Secondary | ICD-10-CM | POA: Insufficient documentation

## 2019-04-05 DIAGNOSIS — Z30011 Encounter for initial prescription of contraceptive pills: Secondary | ICD-10-CM | POA: Insufficient documentation

## 2019-04-05 NOTE — Assessment & Plan Note (Signed)
Counseled to return to gyn for routine exam, pregnancy test negative in office. Rx for OCP which patient selects after counseling.

## 2019-04-05 NOTE — Assessment & Plan Note (Signed)
POC U/A done in the office consistent with infection. Rx macrobid.  

## 2019-06-07 ENCOUNTER — Ambulatory Visit: Payer: BC Managed Care – PPO | Attending: Internal Medicine

## 2019-06-07 DIAGNOSIS — U071 COVID-19: Secondary | ICD-10-CM

## 2019-06-07 DIAGNOSIS — R238 Other skin changes: Secondary | ICD-10-CM

## 2019-06-08 LAB — NOVEL CORONAVIRUS, NAA: SARS-CoV-2, NAA: NOT DETECTED

## 2019-06-11 ENCOUNTER — Other Ambulatory Visit: Payer: BC Managed Care – PPO

## 2019-06-11 ENCOUNTER — Other Ambulatory Visit: Payer: Self-pay | Admitting: *Deleted

## 2019-06-11 DIAGNOSIS — Z20822 Contact with and (suspected) exposure to covid-19: Secondary | ICD-10-CM

## 2019-06-11 DIAGNOSIS — Z20828 Contact with and (suspected) exposure to other viral communicable diseases: Secondary | ICD-10-CM | POA: Diagnosis not present

## 2019-06-12 ENCOUNTER — Other Ambulatory Visit: Payer: BC Managed Care – PPO

## 2019-06-12 LAB — NOVEL CORONAVIRUS, NAA: SARS-CoV-2, NAA: NOT DETECTED

## 2019-06-27 ENCOUNTER — Telehealth: Payer: Self-pay | Admitting: Internal Medicine

## 2019-06-27 NOTE — Telephone Encounter (Signed)
PT IS aware covid 19 results from dec 2020 is neg on 06-27-2019

## 2019-07-25 DIAGNOSIS — D225 Melanocytic nevi of trunk: Secondary | ICD-10-CM | POA: Diagnosis not present

## 2019-07-25 DIAGNOSIS — Z85828 Personal history of other malignant neoplasm of skin: Secondary | ICD-10-CM | POA: Diagnosis not present

## 2019-07-25 DIAGNOSIS — D2271 Melanocytic nevi of right lower limb, including hip: Secondary | ICD-10-CM | POA: Diagnosis not present

## 2019-07-25 DIAGNOSIS — D485 Neoplasm of uncertain behavior of skin: Secondary | ICD-10-CM | POA: Diagnosis not present

## 2019-07-25 DIAGNOSIS — D2272 Melanocytic nevi of left lower limb, including hip: Secondary | ICD-10-CM | POA: Diagnosis not present

## 2019-08-30 DIAGNOSIS — D485 Neoplasm of uncertain behavior of skin: Secondary | ICD-10-CM | POA: Diagnosis not present

## 2019-08-30 DIAGNOSIS — L988 Other specified disorders of the skin and subcutaneous tissue: Secondary | ICD-10-CM | POA: Diagnosis not present

## 2020-01-10 ENCOUNTER — Ambulatory Visit (INDEPENDENT_AMBULATORY_CARE_PROVIDER_SITE_OTHER): Payer: BC Managed Care – PPO | Admitting: Internal Medicine

## 2020-01-10 ENCOUNTER — Other Ambulatory Visit: Payer: Self-pay

## 2020-01-10 ENCOUNTER — Encounter: Payer: Self-pay | Admitting: Internal Medicine

## 2020-01-10 VITALS — BP 110/60 | HR 64 | Temp 98.1°F | Ht 64.0 in | Wt 134.0 lb

## 2020-01-10 DIAGNOSIS — R300 Dysuria: Secondary | ICD-10-CM | POA: Diagnosis not present

## 2020-01-10 MED ORDER — CIPROFLOXACIN HCL 500 MG PO TABS: 500.0000 mg | ORAL_TABLET | Freq: Two times a day (BID) | ORAL | 0 refills | Status: AC

## 2020-01-10 NOTE — Progress Notes (Signed)
Subjective:    Patient ID: Mary Good, female    DOB: May 16, 1981, 39 y.o.   MRN: [id number]  HPI   Here with c/o GU symptoms, 2 days dysuria with hA< low back pain, but without vomiting, fever, chills or hematuria.  Denies urinary symptoms such as frequency, urgency, flank pain.  Pt denies chest pain, increased sob or doe, wheezing, orthopnea, PND, increased LE swelling, palpitations, dizziness or syncope.  Pt denies new neurological symptoms such as new headache, or facial or extremity weakness or numbness   Pt denies polydipsia, polyuria Past Medical History:  Diagnosis Date  . Acute blood loss anemia 08/17/2011   history with 2013 pregnancy  . Acute maxillary sinusitis    History  . History of chicken pox   . Internal hemorrhoids   . PP care - s/p C/S 2/25 08/17/2011   Past Surgical History:  Procedure Laterality Date  . CESAREAN SECTION  08/16/2011   Procedure: CESAREAN SECTION;  Surgeon: Lenoard Aden, MD;  Location: [facility];  Service: Gynecology;  Laterality: N/A;  . CESAREAN SECTION N/A 04/10/2014   Procedure: REPEAT CESAREAN SECTION;  Surgeon: Lenoard Aden, MD;  Location: [facility];  Service: Obstetrics;  Laterality: N/A;  EDD: 04/16/14  . WISDOM TOOTH EXTRACTION  2004    reports that she quit smoking about 17 years ago. Her smoking use included cigarettes. She has a 2.50 pack-year smoking history. She has never used smokeless tobacco. She reports that she does not drink alcohol and does not use drugs. family history includes Cancer in her paternal grandmother; Diabetes in her maternal grandmother; Heart attack in her father; Nephrolithiasis in her father. Allergies  Allergen Reactions  . Penicillins Other (See Comments)    Unknown childhood reaction  . Hydrocodone Other (See Comments)    unknown   Current Outpatient Medications on File Prior to Visit  Medication Sig Dispense Refill  . cyclobenzaprine (FLEXERIL) 5 MG tablet Take 1 tablet (5 mg total) by mouth 3  (three) times daily as needed for muscle spasms. 30 tablet 1  . fluticasone (FLONASE) 50 MCG/ACT nasal spray Place 2 sprays into both nostrils daily. 16 g 6  . nitrofurantoin, macrocrystal-monohydrate, (MACROBID) 100 MG capsule Take 1 capsule (100 mg total) by mouth 2 (two) times daily. 14 capsule 0  . norethindrone-ethinyl estradiol (LOESTRIN FE) 1-20 MG-MCG tablet Take 1 tablet by mouth daily. 1 Package 11   No current facility-administered medications on file prior to visit.   Review of Systems All otherwise neg per pt     Objective:   Physical Exam BP (!) 110/60 (BP Location: Left Arm, Patient Position: Sitting, Cuff Size: Large)   Pulse 64   Temp 98.1 F (36.7 C) (Oral)   Ht 5\' 4"  (1.626 m)   Wt 134 lb (60.8 kg)   SpO2 98%   BMI 23.00 kg/m  VS noted,  Constitutional: Pt appears in NAD HENT: Head: NCAT.  Right Ear: External ear normal.  Left Ear: External ear normal.  Eyes: . Pupils are equal, round, and reactive to light. Conjunctivae and EOM are normal Nose: without d/c or deformity Neck: Neck supple. Gross normal ROM Cardiovascular: Normal rate and regular rhythm.   Pulmonary/Chest: Effort normal and breath sounds without rales or wheezing.  Abd:  Soft, mild low abd tender, ND, + BS, no organomegaly Neurological: Pt is alert. At baseline orientation, motor grossly intact Skin: Skin is warm. No rashes, other new lesions, no LE edema Psychiatric: Pt behavior is  normal without agitation  All otherwise neg per pt Lab Results  Component Value Date   WBC 5.2 11/17/2017   HGB 14.0 11/17/2017   HCT 41.3 11/17/2017   PLT 267.0 11/17/2017   GLUCOSE 106 (H) 11/17/2017   CHOL 142 11/17/2017   TRIG 54.0 11/17/2017   HDL 59.20 11/17/2017   LDLCALC 72 11/17/2017   ALT 10 11/17/2017   AST 11 11/17/2017   NA 140 11/17/2017   K 4.1 11/17/2017   CL 105 11/17/2017   CREATININE 0.79 11/17/2017   BUN 12 11/17/2017   CO2 28 11/17/2017   TSH 1.14 11/17/2017   HGBA1C 5.1  11/17/2017      Assessment & Plan:

## 2020-01-10 NOTE — Patient Instructions (Signed)
Please take all new medication as prescribed - the antibiotic  Please continue all other medications as before, and refills have been done if requested.  Please have the pharmacy call with any other refills you may need.  Please keep your appointments with your specialists as you may have planned  Your specimen will be sent for culture to the lab, and the results normally take 2-3 days

## 2020-01-11 LAB — URINALYSIS, ROUTINE W REFLEX MICROSCOPIC
Bilirubin Urine: NEGATIVE
Glucose, UA: NEGATIVE
Hgb urine dipstick: NEGATIVE
Ketones, ur: NEGATIVE
Leukocytes,Ua: NEGATIVE
Nitrite: NEGATIVE
Protein, ur: NEGATIVE
Specific Gravity, Urine: 1.024 (ref 1.001–1.03)
pH: 6.5 (ref 5.0–8.0)

## 2020-01-11 LAB — URINE CULTURE

## 2020-01-12 ENCOUNTER — Encounter: Payer: Self-pay | Admitting: Internal Medicine

## 2020-01-12 NOTE — Assessment & Plan Note (Addendum)
C/w possible UTI, for urine studies and empiric antbx pending results,  to f/u any worsening symptoms or concerns  I spent 21 minutes in preparing to see the patient by review of recent labs, imaging and procedures, obtaining and reviewing separately obtained history, communicating with the patient and family or caregiver, ordering medications, tests or procedures, and documenting clinical information in the EHR including the differential Dx, treatment, and any further evaluation and other management of dysuria

## 2020-04-26 DIAGNOSIS — Z20822 Contact with and (suspected) exposure to covid-19: Secondary | ICD-10-CM | POA: Diagnosis not present

## 2020-05-08 ENCOUNTER — Telehealth: Payer: BC Managed Care – PPO | Admitting: Family Medicine

## 2020-06-16 DIAGNOSIS — U071 COVID-19: Secondary | ICD-10-CM | POA: Diagnosis not present

## 2020-06-17 ENCOUNTER — Other Ambulatory Visit: Payer: BC Managed Care – PPO

## 2020-06-17 ENCOUNTER — Encounter: Payer: Self-pay | Admitting: Family Medicine

## 2020-06-17 ENCOUNTER — Telehealth (INDEPENDENT_AMBULATORY_CARE_PROVIDER_SITE_OTHER): Payer: BC Managed Care – PPO | Admitting: Family Medicine

## 2020-06-17 VITALS — Temp 98.4°F | Ht 64.0 in | Wt 135.0 lb

## 2020-06-17 DIAGNOSIS — U071 COVID-19: Secondary | ICD-10-CM | POA: Diagnosis not present

## 2020-06-17 MED ORDER — BENZONATATE 100 MG PO CAPS: 100.0000 mg | ORAL_CAPSULE | Freq: Three times a day (TID) | ORAL | 0 refills | Status: AC | PRN

## 2020-06-17 NOTE — Patient Instructions (Addendum)
°  HOME CARE TIPS:  -I sent the medication(s) we discussed to your pharmacy: Meds ordered this encounter  Medications   benzonatate (TESSALON PERLES) 100 MG capsule    Sig: Take 1 capsule (100 mg total) by mouth 3 (three) times daily as needed.    Dispense:  20 capsule    Refill:  0     -can use tylenol or aleve if needed for fevers, aches and pains per instructions  -can use nasal saline a few times per day if nasal congestion, sometime a short course of Afrin nasal spray for 3 days can help as well  -stay hydrated, drink plenty of fluids and eat small healthy meals - avoid dairy  -can take 1000 IU Vit D3 and Vit C lozenges per instructions  -If the Covid test is positive, check out the CDC website for more information on home care, transmission and treatment for COVID19  -follow up with your doctor in 2-3 days unless improving and feeling better  -stay home while sick, except to seek medical care.  Stay home for a minimum of 5 days and until symptoms have resolved.  Then, wear a mask a all times around others for an additional 5 days.  It was nice to meet you today, and I really hope you are feeling better soon. I help [facility] out with telemedicine visits on Tuesdays and Thursdays and am available for visits on those days. If you have any concerns or questions following this visit please schedule a follow up visit with your Primary Care doctor or seek care at a local urgent care clinic to avoid delays in care.    Seek in person care promptly if your symptoms worsen, new concerns arise or you are not improving with treatment. Call 911 and/or seek emergency care if you symptoms are severe or life threatening.

## 2020-06-17 NOTE — Progress Notes (Signed)
Virtual Visit via Video Note  I connected with Mary Good  on 06/17/20 at  4:00 PM EST by a video enabled telemedicine application and verified that I am speaking with the correct person using two identifiers.  Location patient: home,  Location provider:work or home office Persons participating in the virtual visit: patient, provider  I discussed the limitations of evaluation and management by telemedicine and the availability of in person appointments. The patient expressed understanding and agreed to proceed.   HPI:  Acute telemedicine visit for COVID19: -Onset: 06/14/20, positive covid test done yesterday -Symptoms include: fever, feels foggy, congestion , sore throat, body aches, HA, cough -Denies:CP, SOB, NVD, loss of taste, inability to get out of bed, eat and drink, went bowling and an one of the members had covid -Has tried: melatonin, ibuprofen alt with tylenol -Pertinent past medical history: denies any -Pertinent medication allergies:nkda -not pregnant -COVID-19 vaccine status: fully vaccinated - had not had booster  ROS: See pertinent positives and negatives per HPI.  Past Medical History:  Diagnosis Date  . Acute blood loss anemia 08/17/2011   history with 2013 pregnancy  . Acute maxillary sinusitis    History  . History of chicken pox   . Internal hemorrhoids   . PP care - s/p C/S 2/25 08/17/2011    Past Surgical History:  Procedure Laterality Date  . CESAREAN SECTION  08/16/2011   Procedure: CESAREAN SECTION;  Surgeon: Lenoard Aden, MD;  Location: [facility];  Service: Gynecology;  Laterality: N/A;  . CESAREAN SECTION N/A 04/10/2014   Procedure: REPEAT CESAREAN SECTION;  Surgeon: Lenoard Aden, MD;  Location: [facility];  Service: Obstetrics;  Laterality: N/A;  EDD: 04/16/14  . WISDOM TOOTH EXTRACTION  2004     Current Outpatient Medications:  .  benzonatate (TESSALON PERLES) 100 MG capsule, Take 1 capsule (100 mg total) by mouth 3 (three) times daily as  needed., Disp: 20 capsule, Rfl: 0 .  cyclobenzaprine (FLEXERIL) 5 MG tablet, Take 1 tablet (5 mg total) by mouth 3 (three) times daily as needed for muscle spasms. (Patient not taking: Reported on 06/17/2020), Disp: 30 tablet, Rfl: 1 .  fluticasone (FLONASE) 50 MCG/ACT nasal spray, Place 2 sprays into both nostrils daily. (Patient not taking: Reported on 06/17/2020), Disp: 16 g, Rfl: 6 .  nitrofurantoin, macrocrystal-monohydrate, (MACROBID) 100 MG capsule, Take 1 capsule (100 mg total) by mouth 2 (two) times daily. (Patient not taking: Reported on 06/17/2020), Disp: 14 capsule, Rfl: 0 .  norethindrone-ethinyl estradiol (LOESTRIN FE) 1-20 MG-MCG tablet, Take 1 tablet by mouth daily. (Patient not taking: Reported on 06/17/2020), Disp: 1 Package, Rfl: 11  EXAM:  VITALS per patient if applicable:  GENERAL: alert, oriented, appears well and in no acute distress  HEENT: atraumatic, conjunttiva clear, no obvious abnormalities on inspection of external nose and ears  NECK: normal movements of the head and neck  LUNGS: on inspection no signs of respiratory distress, breathing rate appears normal, no obvious gross SOB, gasping or wheezing  CV: no obvious cyanosis  MS: moves all visible extremities without noticeable abnormality  PSYCH/NEURO: pleasant and cooperative, no obvious depression or anxiety, speech and thought processing grossly intact  ASSESSMENT AND PLAN:  Discussed the following assessment and plan:  COVID-19  -we discussed possible serious and likely etiologies, options for evaluation and workup, limitations of telemedicine visit vs in person visit, treatment, treatment risks and precautions. Pt prefers to treat via telemedicine empirically rather than in person at this moment.  Discussed treatment,  potential complications, isolation and precautions.  Opted for Tessalon for cough, short course nasal decongestant, nasal saline and analgesic if needed. Work/School slipped offered:    declined Scheduled follow up with PCP offered: Agrees to follow-up if needed. Advised to seek prompt in person care if worsening, new symptoms arise, or if is not improving with treatment. Discussed options for inperson care if PCP office not available. Did let this patient know that I only do telemedicine on Tuesdays and Thursdays for [place]. Advised to schedule follow up visit with PCP or [facility] if any further questions or concerns to avoid delays in care.   I discussed the assessment and treatment plan with the patient. The patient was provided an opportunity to ask questions and all were answered. The patient agreed with the plan and demonstrated an understanding of the instructions.     Terressa Koyanagi, DO

## 2020-06-25 NOTE — Telephone Encounter (Signed)
   Patient calling for COVID quarantine advice. 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 Advised patient to follow CDC guidelines, she has additional questions.  Please call

## 2020-07-31 DIAGNOSIS — Z1331 Encounter for screening for depression: Secondary | ICD-10-CM | POA: Diagnosis not present

## 2020-07-31 DIAGNOSIS — F3281 Premenstrual dysphoric disorder: Secondary | ICD-10-CM | POA: Diagnosis not present

## 2020-08-06 DIAGNOSIS — D2271 Melanocytic nevi of right lower limb, including hip: Secondary | ICD-10-CM | POA: Diagnosis not present

## 2020-08-06 DIAGNOSIS — L905 Scar conditions and fibrosis of skin: Secondary | ICD-10-CM | POA: Diagnosis not present

## 2020-08-06 DIAGNOSIS — C44519 Basal cell carcinoma of skin of other part of trunk: Secondary | ICD-10-CM | POA: Diagnosis not present

## 2020-08-06 DIAGNOSIS — D485 Neoplasm of uncertain behavior of skin: Secondary | ICD-10-CM | POA: Diagnosis not present

## 2020-08-06 DIAGNOSIS — Z85828 Personal history of other malignant neoplasm of skin: Secondary | ICD-10-CM | POA: Diagnosis not present

## 2020-08-06 DIAGNOSIS — D2372 Other benign neoplasm of skin of left lower limb, including hip: Secondary | ICD-10-CM | POA: Diagnosis not present

## 2020-08-06 DIAGNOSIS — D2272 Melanocytic nevi of left lower limb, including hip: Secondary | ICD-10-CM | POA: Diagnosis not present

## 2020-08-06 DIAGNOSIS — D225 Melanocytic nevi of trunk: Secondary | ICD-10-CM | POA: Diagnosis not present

## 2020-09-15 DIAGNOSIS — R0981 Nasal congestion: Secondary | ICD-10-CM | POA: Diagnosis not present

## 2020-09-15 DIAGNOSIS — B9689 Other specified bacterial agents as the cause of diseases classified elsewhere: Secondary | ICD-10-CM | POA: Diagnosis not present

## 2020-10-22 DIAGNOSIS — Z0000 Encounter for general adult medical examination without abnormal findings: Secondary | ICD-10-CM | POA: Diagnosis not present

## 2020-10-28 DIAGNOSIS — Z0000 Encounter for general adult medical examination without abnormal findings: Secondary | ICD-10-CM | POA: Diagnosis not present

## 2020-10-28 DIAGNOSIS — Z1331 Encounter for screening for depression: Secondary | ICD-10-CM | POA: Diagnosis not present

## 2020-10-28 DIAGNOSIS — Z1339 Encounter for screening examination for other mental health and behavioral disorders: Secondary | ICD-10-CM | POA: Diagnosis not present

## 2021-03-09 DIAGNOSIS — D2271 Melanocytic nevi of right lower limb, including hip: Secondary | ICD-10-CM | POA: Diagnosis not present

## 2021-03-09 DIAGNOSIS — D485 Neoplasm of uncertain behavior of skin: Secondary | ICD-10-CM | POA: Diagnosis not present

## 2021-03-09 DIAGNOSIS — C44519 Basal cell carcinoma of skin of other part of trunk: Secondary | ICD-10-CM | POA: Diagnosis not present

## 2021-08-06 DIAGNOSIS — D2261 Melanocytic nevi of right upper limb, including shoulder: Secondary | ICD-10-CM | POA: Diagnosis not present

## 2021-08-06 DIAGNOSIS — Z85828 Personal history of other malignant neoplasm of skin: Secondary | ICD-10-CM | POA: Diagnosis not present

## 2021-08-06 DIAGNOSIS — D225 Melanocytic nevi of trunk: Secondary | ICD-10-CM | POA: Diagnosis not present

## 2021-08-06 DIAGNOSIS — D2262 Melanocytic nevi of left upper limb, including shoulder: Secondary | ICD-10-CM | POA: Diagnosis not present

## 2021-08-18 DIAGNOSIS — R635 Abnormal weight gain: Secondary | ICD-10-CM | POA: Diagnosis not present

## 2021-08-21 DIAGNOSIS — J069 Acute upper respiratory infection, unspecified: Secondary | ICD-10-CM | POA: Diagnosis not present

## 2021-08-21 DIAGNOSIS — R0981 Nasal congestion: Secondary | ICD-10-CM | POA: Diagnosis not present

## 2021-08-31 DIAGNOSIS — R051 Acute cough: Secondary | ICD-10-CM | POA: Diagnosis not present

## 2021-10-23 ENCOUNTER — Other Ambulatory Visit: Payer: Self-pay | Admitting: Internal Medicine

## 2021-10-23 ENCOUNTER — Ambulatory Visit
Admission: RE | Admit: 2021-10-23 | Discharge: 2021-10-23 | Disposition: A | Discharge status: Home / Self Care | Payer: BC Managed Care – PPO | Source: Ambulatory Visit | Attending: Internal Medicine | Admitting: Internal Medicine

## 2021-10-23 DIAGNOSIS — Z1231 Encounter for screening mammogram for malignant neoplasm of breast: Secondary | ICD-10-CM

## 2021-10-23 DIAGNOSIS — R7989 Other specified abnormal findings of blood chemistry: Secondary | ICD-10-CM | POA: Diagnosis not present

## 2021-10-30 DIAGNOSIS — F3281 Premenstrual dysphoric disorder: Secondary | ICD-10-CM | POA: Diagnosis not present

## 2021-10-30 DIAGNOSIS — Z1339 Encounter for screening examination for other mental health and behavioral disorders: Secondary | ICD-10-CM | POA: Diagnosis not present

## 2021-10-30 DIAGNOSIS — Z1331 Encounter for screening for depression: Secondary | ICD-10-CM | POA: Diagnosis not present

## 2021-10-30 DIAGNOSIS — Z0000 Encounter for general adult medical examination without abnormal findings: Secondary | ICD-10-CM | POA: Diagnosis not present

## 2021-12-17 ENCOUNTER — Ambulatory Visit (INDEPENDENT_AMBULATORY_CARE_PROVIDER_SITE_OTHER): Payer: BC Managed Care – PPO | Admitting: Neurology

## 2021-12-17 ENCOUNTER — Encounter: Payer: Self-pay | Admitting: Neurology

## 2021-12-17 VITALS — BP 110/76 | HR 78 | Ht 64.0 in | Wt 146.1 lb

## 2021-12-17 DIAGNOSIS — E663 Overweight: Secondary | ICD-10-CM | POA: Diagnosis not present

## 2021-12-17 DIAGNOSIS — Z820 Family history of epilepsy and other diseases of the nervous system: Secondary | ICD-10-CM

## 2021-12-17 DIAGNOSIS — R635 Abnormal weight gain: Secondary | ICD-10-CM

## 2021-12-17 DIAGNOSIS — G4719 Other hypersomnia: Secondary | ICD-10-CM | POA: Diagnosis not present

## 2021-12-17 DIAGNOSIS — R0681 Apnea, not elsewhere classified: Secondary | ICD-10-CM | POA: Diagnosis not present

## 2021-12-17 DIAGNOSIS — R351 Nocturia: Secondary | ICD-10-CM

## 2021-12-17 DIAGNOSIS — R0683 Snoring: Secondary | ICD-10-CM

## 2021-12-17 NOTE — Patient Instructions (Signed)

## 2021-12-17 NOTE — Progress Notes (Signed)
Subjective:    Patient ID: Mary Good is a 41 y.o. female.  HPI    Huston Foley, MD, PhD [facility] [address] [address] [place], [place] [address]  Dear Dr. Nadene Rubins,   I saw your patient, Mary Good, upon your kind request in my sleep clinic today for initial consultation of her sleep disorder, in particular, concern for underlying obstructive sleep apnea.  The patient is unaccompanied today.  As you know, Mary Good is a 41 year old right-handed woman with an underlying medical history of premenstrual dysphoric disorder, basal cell cancer, and borderline overweight state, who reports snoring and excessive daytime somnolence as well as witnessed apneas per husband's report.  Her snoring is disturbing to him.  She has a family history of sleep apnea, her father has a CPAP machine and also had sleep apnea surgery in the past.  I reviewed your office records from 08/31/2021 as well as 08/21/2021 and 08/18/2021.  Her Epworth sleepiness score is 13 out of 24, fatigue severity score is 37 out of 63.  She reports a bedtime of around 10 PM and a rise time around 7 AM.  She lives with her family which includes her husband and 83-year-old as well as 66 year old children, she has 1 dog in the household, the dog sleeps in the bedroom, on the floor.  She has a TV in the bedroom and turns it off at 10 PM.  She is self-employed.  She has had weight gain in the past year, within 10 pounds.  She has nocturia about once per average night, no recurrent nocturnal or morning headaches.  She drinks caffeine in the form of coffee, 1 cup in the morning and 1 iced tea during the day.  She is a non-smoker and does not currently drink any alcohol.   Her Past Medical History Is Significant For: Past Medical History:  Diagnosis Date   Acute blood loss anemia 08/17/2011   history with 2013 pregnancy   Acute maxillary sinusitis    History   History of chicken pox    Internal hemorrhoids     PP care - s/p C/S 2/25 08/17/2011    Her Past Surgical History Is Significant For: Past Surgical History:  Procedure Laterality Date   CESAREAN SECTION  08/16/2011   Procedure: CESAREAN SECTION;  Surgeon: Lenoard Aden, MD;  Location: [facility];  Service: Gynecology;  Laterality: N/A;   CESAREAN SECTION N/A 04/10/2014   Procedure: REPEAT CESAREAN SECTION;  Surgeon: Lenoard Aden, MD;  Location: [facility];  Service: Obstetrics;  Laterality: N/A;  EDD: 04/16/14   WISDOM TOOTH EXTRACTION  2004    Her Family History Is Significant For: Family History  Problem Relation Age of Onset   Nephrolithiasis Father    Heart attack Father    Sleep apnea Father    Diabetes Maternal Grandmother    Cancer Paternal Grandmother        breast   Colon cancer Neg Hx    Esophageal cancer Neg Hx    Breast cancer Neg Hx     Her Social History Is Significant For: Social History   Socioeconomic History   Marital status: Married    Spouse name: Not on file   Number of children: Not on file   Years of education: Not on file   Highest education level: Not on file  Occupational History   Not on file  Tobacco Use   Smoking status: Former    Packs/day:  0.50    Years: 5.00    Total pack years: 2.50    Types: Cigarettes    Quit date: 08/12/2002    Years since quitting: 19.3   Smokeless tobacco: Never   Tobacco comments:    in college  Vaping Use   Vaping Use: Never used  Substance and Sexual Activity   Alcohol use: No   Drug use: No   Sexual activity: Yes    Birth control/protection: None  Other Topics Concern   Not on file  Social History Narrative   Not on file   Social Determinants of Health   Financial Resource Strain: Not on file  Food Insecurity: Not on file  Transportation Needs: Not on file  Physical Activity: Not on file  Stress: Not on file  Social Connections: Not on file    Her Allergies Are:  Allergies  Allergen Reactions   Penicillins Other (See Comments)     Unknown childhood reaction   Hydrocodone Other (See Comments)    unknown  :   Her Current Medications Are:  Outpatient Encounter Medications as of 12/17/2021  Medication Sig   buPROPion (WELLBUTRIN SR) 150 MG 12 hr tablet Take 150 mg by mouth every morning.   benzonatate (TESSALON PERLES) 100 MG capsule Take 1 capsule (100 mg total) by mouth 3 (three) times daily as needed.   cyclobenzaprine (FLEXERIL) 5 MG tablet Take 1 tablet (5 mg total) by mouth 3 (three) times daily as needed for muscle spasms.   fluticasone (FLONASE) 50 MCG/ACT nasal spray Place 2 sprays into both nostrils daily.   nitrofurantoin, macrocrystal-monohydrate, (MACROBID) 100 MG capsule Take 1 capsule (100 mg total) by mouth 2 (two) times daily.   norethindrone-ethinyl estradiol (LOESTRIN FE) 1-20 MG-MCG tablet Take 1 tablet by mouth daily.   No facility-administered encounter medications on file as of 12/17/2021.  :   Review of Systems:  Out of a complete 14 point review of systems, all are reviewed and negative with the exception of these symptoms as listed below:    Review of Systems  Neurological:        Pt here for sleep consult Pt states snore,fatigue,headaches,hypertension Pt denies sleep study,CPAP machine    ESS:13 FSS:37    Objective:  Neurological Exam  Physical Exam Physical Examination:   Vitals:   12/17/21 1535  BP: 110/76  Pulse: 78    General Examination: The patient is a very pleasant 41 y.o. female in no acute distress. She appears well-developed and well-nourished and well groomed.   HEENT: Normocephalic, atraumatic, pupils are equal, round and reactive to light, extraocular tracking is good without limitation to gaze excursion or nystagmus noted. Hearing is grossly intact. Face is symmetric with normal facial animation. Speech is clear with no dysarthria noted. There is no hypophonia. There is no lip, neck/head, jaw or voice tremor. Neck is supple with full range of passive and active  motion. There are no carotid bruits on auscultation. Oropharynx exam reveals: mild mouth dryness, good dental hygiene and mild airway crowding, due to small airway entry, Mallampati class II, slightly wider uvula, tonsillar size is small.  Neck circumference of 13 inches, minimal overbite noted.  Tongue protrudes centrally and palate elevates symmetrically.  Chest: Clear to auscultation without wheezing, rhonchi or crackles noted.  Heart: S1+S2+0, regular and normal without murmurs, rubs or gallops noted.   Abdomen: Soft, non-tender and non-distended.  Extremities: There is no pitting edema in the distal lower extremities bilaterally.   Skin: Warm and  dry without trophic changes noted.   Musculoskeletal: exam reveals no obvious joint deformities.   Neurologically:  Mental status: The patient is awake, alert and oriented in all 4 spheres. Her immediate and remote memory, attention, language skills and fund of knowledge are appropriate. There is no evidence of aphasia, agnosia, apraxia or anomia. Speech is clear with normal prosody and enunciation. Thought process is linear. Mood is normal and affect is normal.  Cranial nerves II - XII are as described above under HEENT exam.  Motor exam: Normal bulk, strength and tone is noted. There is no obvious tremor.  Fine motor skills and coordination: grossly intact.  Cerebellar testing: No dysmetria or intention tremor. There is no truncal or gait ataxia.  Sensory exam: intact to light touch in the upper and lower extremities.  Gait, station and balance: She stands easily. No veering to one side is noted. No leaning to one side is noted. Posture is age-appropriate and stance is narrow based. Gait shows normal stride length and normal pace. No problems turning are noted.   Assessment and Plan:   In summary, Mary Good is a very pleasant 41 y.o.-year old female with an underlying medical history of premenstrual dysphoric disorder, basal cell cancer,  and borderline overweight state, whose history and physical exam concerning for obstructive sleep apnea (OSA). I had a long chat with the patient about my findings and the diagnosis of OSA, its prognosis and treatment options. We talked about medical treatments, surgical interventions and non-pharmacological approaches. I explained in particular the risks and ramifications of untreated moderate to severe OSA, especially with respect to developing cardiovascular disease down the Road, including congestive heart failure, difficult to treat hypertension, cardiac arrhythmias, or stroke. Even type 2 diabetes has, in part, been linked to untreated OSA. Symptoms of untreated OSA include daytime sleepiness, memory problems, mood irritability and mood disorder such as depression and anxiety, lack of energy, as well as recurrent headaches, especially morning headaches. We talked about trying to maintain a healthy lifestyle in general, as well as the importance of weight control. We also talked about the importance of good sleep hygiene.  I recommended the following at this time: sleep study.  I outlined the differences between a laboratory attended sleep study versus home sleep testing.  She would be willing to come and stay for an overnight polysomnogram. I explained the sleep test procedure to the patient and also outlined possible surgical and non-surgical treatment options of OSA, including the use of a custom-made dental device (which would require a referral to a specialist dentist or oral surgeon), upper airway surgical options, such as traditional UPPP or a novel less invasive surgical option in the form of Inspire hypoglossal nerve stimulation (which would involve a referral to an ENT surgeon). I also explained the CPAP treatment option to the patient, who indicated that she would be willing to try PAP therapy, if the need arises.  Discussion after testing.  We will keep her posted as to her test results by phone  call and plan a follow-up in sleep clinic accordingly.  I answered all her questions today and she was in agreement.  Thank you very much for allowing me to participate in the care of this nice patient. If I can be of any further assistance to you please do not hesitate to call me at [phone].  Sincerely,   Huston Foley, MD, PhD

## 2021-12-30 ENCOUNTER — Telehealth: Payer: Self-pay | Admitting: Neurology

## 2021-12-30 NOTE — Telephone Encounter (Signed)
MAIL OUT- HST [employer] [place]: [phone] (exp. 12/28/21 to 02/25/22). Patient is aware that she will receive the HST in the mail. Once she receive's it she is aware to follow the instructions and to do the sleep study as soon as she gets the device. She is also aware to not throw the device away until she receives a call from [place] that we have enough data for her study.

## 2021-12-31 ENCOUNTER — Institutional Professional Consult (permissible substitution): Payer: Self-pay | Admitting: Neurology

## 2022-01-06 ENCOUNTER — Telehealth: Payer: Self-pay | Admitting: Neurology

## 2022-01-06 ENCOUNTER — Ambulatory Visit: Payer: Self-pay | Admitting: Neurology

## 2022-01-06 DIAGNOSIS — G4719 Other hypersomnia: Secondary | ICD-10-CM

## 2022-01-06 DIAGNOSIS — R351 Nocturia: Secondary | ICD-10-CM

## 2022-01-06 DIAGNOSIS — R0683 Snoring: Secondary | ICD-10-CM

## 2022-01-06 DIAGNOSIS — G4710 Hypersomnia, unspecified: Secondary | ICD-10-CM

## 2022-01-06 DIAGNOSIS — R635 Abnormal weight gain: Secondary | ICD-10-CM

## 2022-01-06 DIAGNOSIS — Z820 Family history of epilepsy and other diseases of the nervous system: Secondary | ICD-10-CM

## 2022-01-06 DIAGNOSIS — R0681 Apnea, not elsewhere classified: Secondary | ICD-10-CM

## 2022-01-06 DIAGNOSIS — E663 Overweight: Secondary | ICD-10-CM

## 2022-01-06 DIAGNOSIS — Z0289 Encounter for other administrative examinations: Secondary | ICD-10-CM

## 2022-01-06 NOTE — Telephone Encounter (Signed)
Pt called an canceled her appointment said she was not feeling well.

## 2022-01-07 NOTE — Procedures (Signed)
   [facility]  HOME SLEEP TEST (Watch PAT) REPORT  STUDY DATE: 01/06/2022  DOB: 25-Jul-1980  MRN: [id number]  ORDERING CLINICIAN: Huston Foley, MD, PhD   REFERRING CLINICIAN: Melida Quitter, MD   CLINICAL INFORMATION/HISTORY: 41 year old right-handed woman with an underlying medical history of premenstrual dysphoric disorder, basal cell cancer, and borderline overweight state, who reports snoring and excessive daytime somnolence as well as witnessed apneas.    Epworth sleepiness score: 13/24.  BMI: 24.8 kg/m  FINDINGS:   Sleep Summary:   Total Recording Time (hours, min): 8 hours, 33 min  Total Sleep Time (hours, min):  7 hours, 47 min  Percent REM (%):    33%   Respiratory Indices:   Calculated pAHI (per hour):  2.1/hour         REM pAHI:    3.5/hour       NREM pAHI: 1.4/hour  Central pAHI: 0.1/hour  Oxygen Saturation Statistics:    Oxygen Saturation (%) Mean: 95%   Minimum oxygen saturation (%):                 91%   O2 Saturation Range (%): 91-98%    O2 Saturation (minutes) <=88%: 0 min  Pulse Rate Statistics:   Pulse Mean (bpm):    74/min    Pulse Range (53-113/min)   IMPRESSION: Primary snoring  RECOMMENDATION:  This home sleep test does not demonstrate any significant obstructive or central sleep disordered breathing with a total AHI of 2.1/h, O2 nadir 91%.  Intermittent mild to moderate snoring was detected, appeared to be mostly in the mild range. Other causes of the patient's symptoms, including circadian rhythm disturbances, an underlying mood disorder, medication effect and/or an underlying medical problem cannot be ruled out based on this test. Clinical correlation is recommended. The patient should be cautioned not to drive, work at heights, or operate dangerous or heavy equipment when tired or sleepy. Review and reiteration of good sleep hygiene measures should be pursued with any patient. The patient can follow up with her  referring provider, who will be notified of the test results.   I certify that I have reviewed the raw data recording prior to the issuance of this report in accordance with the standards of the American Academy of Sleep Medicine (AASM).  INTERPRETING PHYSICIAN:   Huston Foley, MD, PhD  Board Certified in Neurology and Sleep Medicine  [facility] [address] [place], [place] [address] [phone]

## 2022-01-07 NOTE — Progress Notes (Signed)
See procedure note.

## 2022-01-11 ENCOUNTER — Telehealth: Payer: Self-pay

## 2022-01-11 NOTE — Telephone Encounter (Signed)
I called pt. No answer, left a message asking pt to call me back.   

## 2022-01-11 NOTE — Telephone Encounter (Signed)
-----   Message from Huston Foley, MD sent at 01/07/2022  6:34 PM EDT ----- Patient referred by Dr. Nadene Rubins, seen by me on 12/17/2021, HST on 01/06/2022.   Please call and notify the patient that the recent home sleep test did not show any significant obstructive sleep apnea.  She had mild to moderate snoring detected, appeared to be mostly in the mild range. She does not qualify for CPAP or AutoPap therapy.  She can at this juncture follow up with the referring provider.   Thanks,  Huston Foley, MD, PhD [facility])

## 2022-01-12 ENCOUNTER — Encounter: Payer: Self-pay | Admitting: *Deleted

## 2022-01-12 NOTE — Telephone Encounter (Signed)
ERROR

## 2022-01-12 NOTE — Telephone Encounter (Signed)
Spoke with patient and discussed sleep study results. Pt aware her HST did not show any significant OSA. Mild to moderate (mostly mild) snoring detected. Does not qualify for CPAP or AutoPap therapy. Can follow-up with referring provider at this point, does not need to see sleep clinic again. Pt verbalized understanding and appreciation. She did not have any questions. Report sent to Dr Nadene Rubins.

## 2022-01-12 NOTE — Telephone Encounter (Signed)
LVM for patient to call back to discuss sleep results

## 2022-05-04 DIAGNOSIS — H01119 Allergic dermatitis of unspecified eye, unspecified eyelid: Secondary | ICD-10-CM | POA: Diagnosis not present

## 2022-06-01 DIAGNOSIS — J101 Influenza due to other identified influenza virus with other respiratory manifestations: Secondary | ICD-10-CM | POA: Diagnosis not present

## 2022-06-01 DIAGNOSIS — R0981 Nasal congestion: Secondary | ICD-10-CM | POA: Diagnosis not present

## 2022-06-08 DIAGNOSIS — J029 Acute pharyngitis, unspecified: Secondary | ICD-10-CM | POA: Diagnosis not present

## 2022-06-08 DIAGNOSIS — J101 Influenza due to other identified influenza virus with other respiratory manifestations: Secondary | ICD-10-CM | POA: Diagnosis not present

## 2022-08-17 DIAGNOSIS — R0981 Nasal congestion: Secondary | ICD-10-CM | POA: Diagnosis not present

## 2022-08-17 DIAGNOSIS — R110 Nausea: Secondary | ICD-10-CM | POA: Diagnosis not present

## 2022-08-17 DIAGNOSIS — Z1152 Encounter for screening for COVID-19: Secondary | ICD-10-CM | POA: Diagnosis not present

## 2022-08-17 DIAGNOSIS — R509 Fever, unspecified: Secondary | ICD-10-CM | POA: Diagnosis not present

## 2022-08-17 DIAGNOSIS — R5383 Other fatigue: Secondary | ICD-10-CM | POA: Diagnosis not present

## 2022-08-17 DIAGNOSIS — U071 COVID-19: Secondary | ICD-10-CM | POA: Diagnosis not present

## 2022-10-26 DIAGNOSIS — R7989 Other specified abnormal findings of blood chemistry: Secondary | ICD-10-CM | POA: Diagnosis not present

## 2022-10-26 DIAGNOSIS — R5383 Other fatigue: Secondary | ICD-10-CM | POA: Diagnosis not present

## 2022-10-26 DIAGNOSIS — Z79899 Other long term (current) drug therapy: Secondary | ICD-10-CM | POA: Diagnosis not present

## 2022-11-02 DIAGNOSIS — Z1339 Encounter for screening examination for other mental health and behavioral disorders: Secondary | ICD-10-CM | POA: Diagnosis not present

## 2022-11-02 DIAGNOSIS — F3281 Premenstrual dysphoric disorder: Secondary | ICD-10-CM | POA: Diagnosis not present

## 2022-11-02 DIAGNOSIS — Z1331 Encounter for screening for depression: Secondary | ICD-10-CM | POA: Diagnosis not present

## 2022-11-02 DIAGNOSIS — Z23 Encounter for immunization: Secondary | ICD-10-CM | POA: Diagnosis not present

## 2022-11-02 DIAGNOSIS — R5383 Other fatigue: Secondary | ICD-10-CM | POA: Diagnosis not present

## 2022-11-02 DIAGNOSIS — Z0000 Encounter for general adult medical examination without abnormal findings: Secondary | ICD-10-CM | POA: Diagnosis not present

## 2023-01-25 DIAGNOSIS — L814 Other melanin hyperpigmentation: Secondary | ICD-10-CM | POA: Diagnosis not present

## 2023-01-25 DIAGNOSIS — D492 Neoplasm of unspecified behavior of bone, soft tissue, and skin: Secondary | ICD-10-CM | POA: Diagnosis not present

## 2023-01-25 DIAGNOSIS — L821 Other seborrheic keratosis: Secondary | ICD-10-CM | POA: Diagnosis not present

## 2023-01-25 DIAGNOSIS — D225 Melanocytic nevi of trunk: Secondary | ICD-10-CM | POA: Diagnosis not present

## 2023-02-24 DIAGNOSIS — D225 Melanocytic nevi of trunk: Secondary | ICD-10-CM | POA: Diagnosis not present

## 2023-02-24 DIAGNOSIS — D492 Neoplasm of unspecified behavior of bone, soft tissue, and skin: Secondary | ICD-10-CM | POA: Diagnosis not present

## 2023-02-24 DIAGNOSIS — C4441 Basal cell carcinoma of skin of scalp and neck: Secondary | ICD-10-CM | POA: Diagnosis not present

## 2023-04-18 DIAGNOSIS — J189 Pneumonia, unspecified organism: Secondary | ICD-10-CM | POA: Diagnosis not present

## 2023-04-18 DIAGNOSIS — R058 Other specified cough: Secondary | ICD-10-CM | POA: Diagnosis not present

## 2023-05-17 DIAGNOSIS — J189 Pneumonia, unspecified organism: Secondary | ICD-10-CM | POA: Diagnosis not present

## 2023-06-29 DIAGNOSIS — R0981 Nasal congestion: Secondary | ICD-10-CM | POA: Diagnosis not present

## 2023-06-29 DIAGNOSIS — R058 Other specified cough: Secondary | ICD-10-CM | POA: Diagnosis not present

## 2023-08-25 DIAGNOSIS — R059 Cough, unspecified: Secondary | ICD-10-CM | POA: Diagnosis not present

## 2023-08-25 DIAGNOSIS — N390 Urinary tract infection, site not specified: Secondary | ICD-10-CM | POA: Diagnosis not present

## 2023-08-25 DIAGNOSIS — H6690 Otitis media, unspecified, unspecified ear: Secondary | ICD-10-CM | POA: Diagnosis not present

## 2023-10-11 IMAGING — MG MM DIGITAL SCREENING BILAT W/ TOMO AND CAD
6 of 10 series · 6 of 30 positions shown · non-contrast
Comparison: None available.

CLINICAL DATA: Screening.

EXAM:
DIGITAL SCREENING BILATERAL MAMMOGRAM WITH TOMOSYNTHESIS AND CAD
TECHNIQUE: Bilateral screening digital craniocaudal and mediolateral oblique
mammograms were obtained. Bilateral screening digital breast
tomosynthesis was performed. The images were evaluated with
computer-aided detection.

[L MLO synth-2D (1 of 2)]
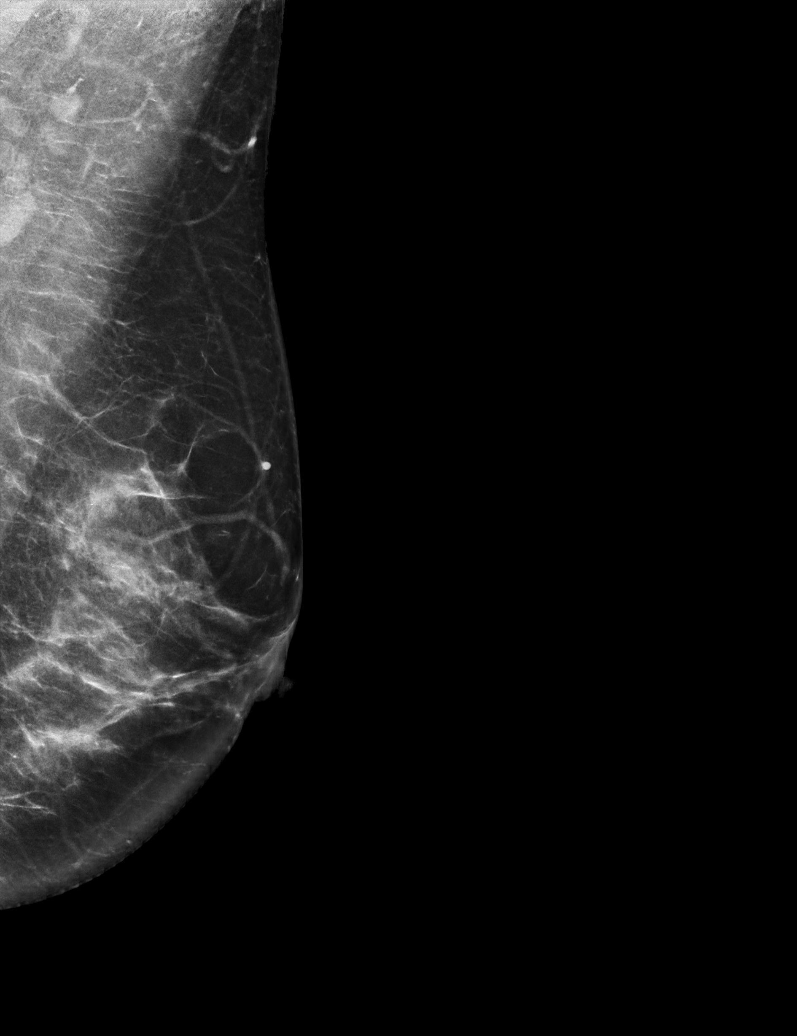

[L CC synth-2D]
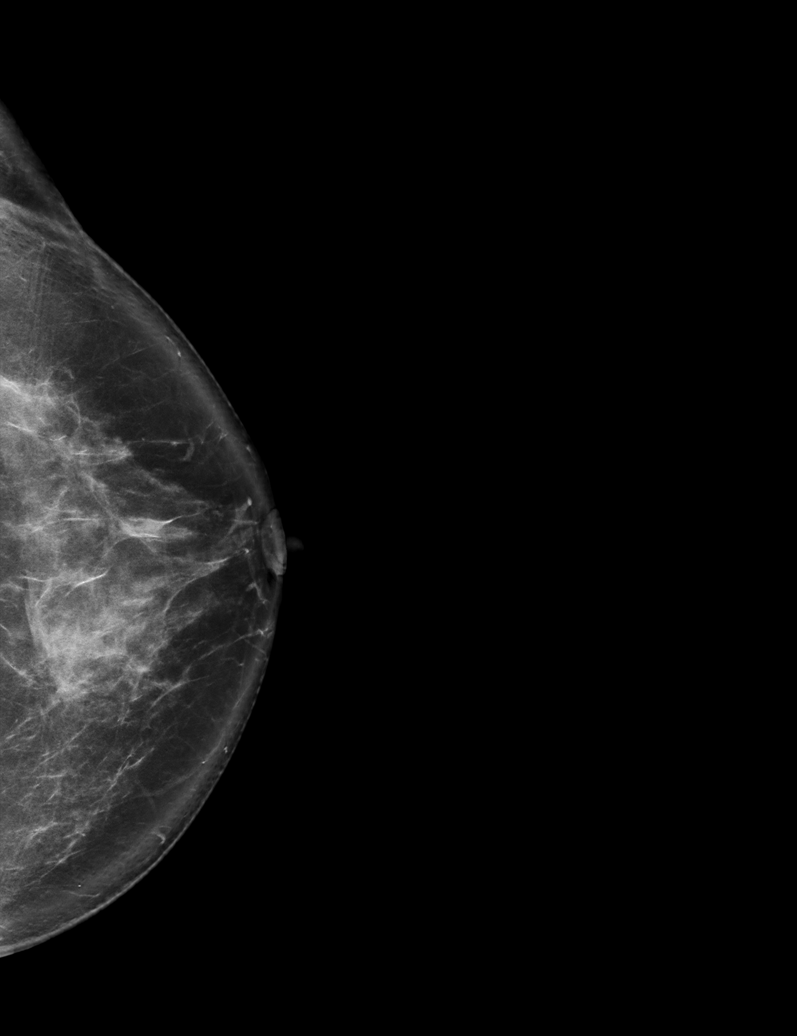

[L MLO synth-2D (2 of 2)]
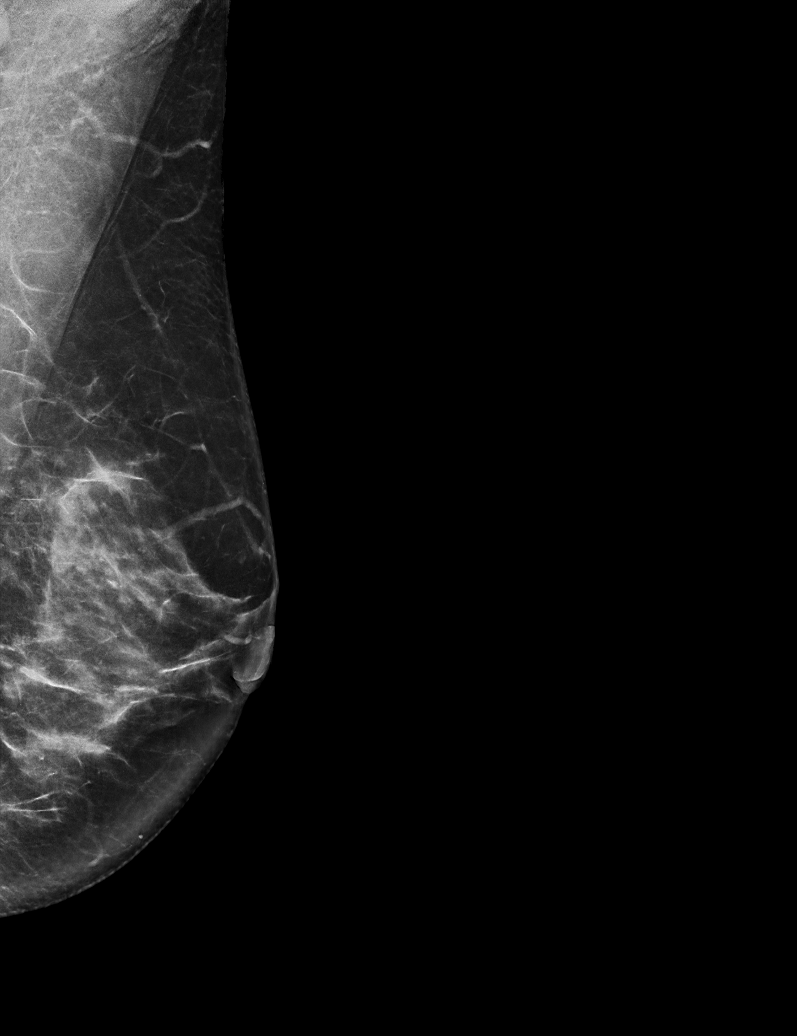

[R MLO synth-2D]
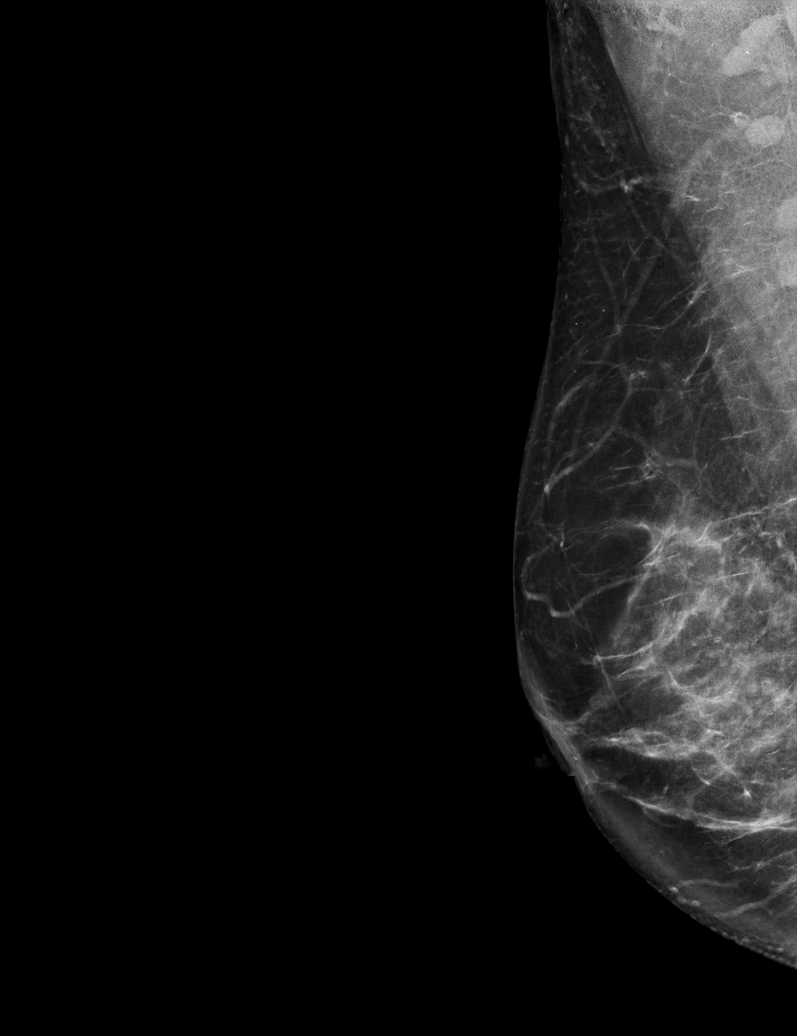

[R CC synth-2D]
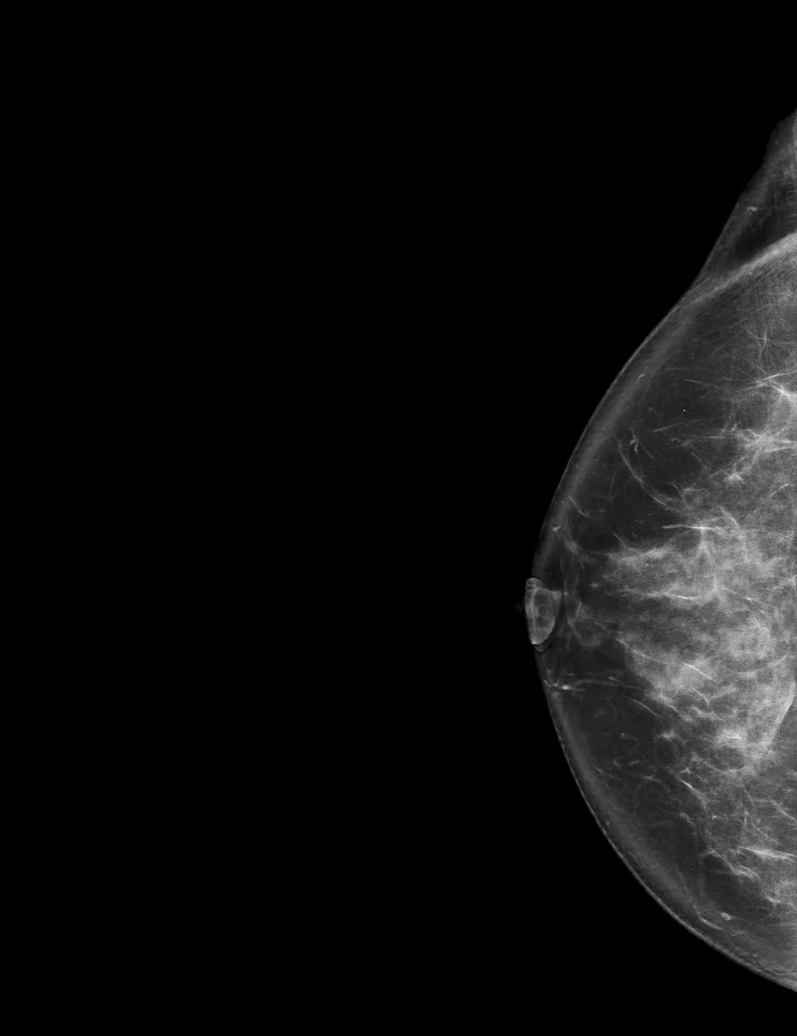

[R MLO tomo · tomo slice 39/78.0]
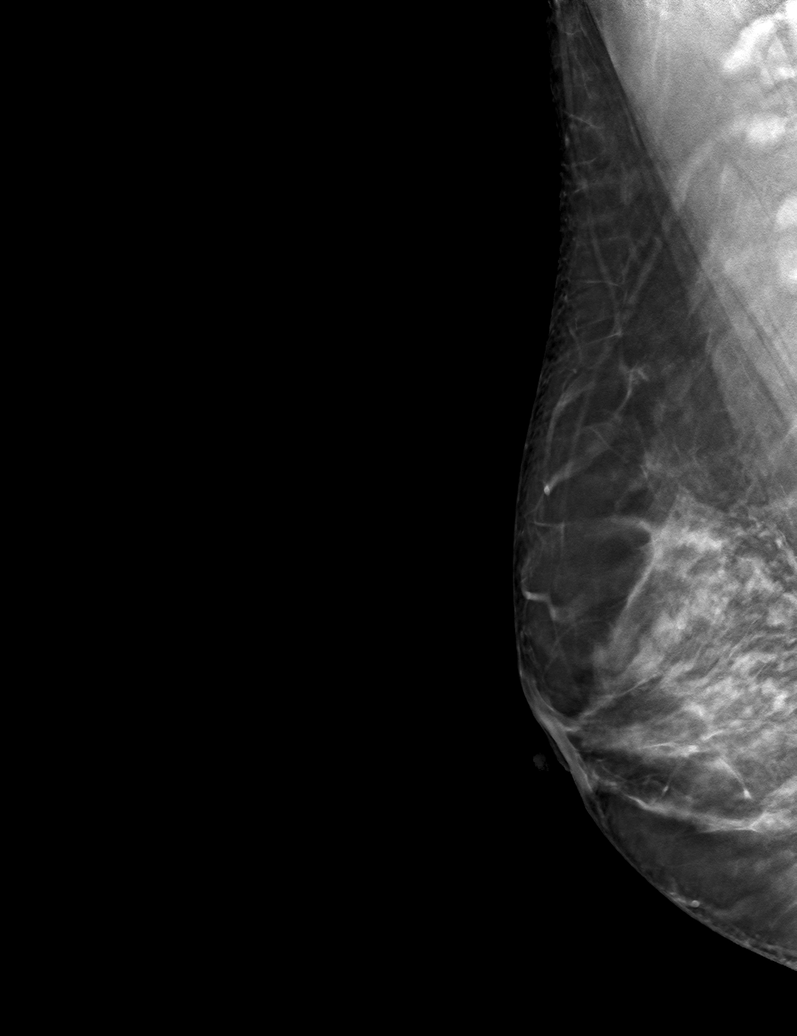

[6 of 30 positions shown; findings below may reference images not displayed]

ACR Breast Density Category c: The breast tissue is heterogeneously
dense, which may obscure small masses
FINDINGS: There are no findings suspicious for malignancy.
IMPRESSION: No mammographic evidence of malignancy. A result letter of this
screening mammogram will be mailed directly to the patient.

RECOMMENDATION:
Screening mammogram in one year. (Code:A7-O-PCM)

BI-RADS CATEGORY  1: Negative.

## 2023-11-01 DIAGNOSIS — R7989 Other specified abnormal findings of blood chemistry: Secondary | ICD-10-CM | POA: Diagnosis not present

## 2023-11-21 DIAGNOSIS — L729 Follicular cyst of the skin and subcutaneous tissue, unspecified: Secondary | ICD-10-CM | POA: Diagnosis not present

## 2023-11-22 DIAGNOSIS — D170 Benign lipomatous neoplasm of skin and subcutaneous tissue of head, face and neck: Secondary | ICD-10-CM | POA: Diagnosis not present

## 2023-12-02 DIAGNOSIS — D171 Benign lipomatous neoplasm of skin and subcutaneous tissue of trunk: Secondary | ICD-10-CM | POA: Diagnosis not present

## 2023-12-14 DIAGNOSIS — R300 Dysuria: Secondary | ICD-10-CM | POA: Diagnosis not present

## 2023-12-14 DIAGNOSIS — N898 Other specified noninflammatory disorders of vagina: Secondary | ICD-10-CM | POA: Diagnosis not present

## 2023-12-29 DIAGNOSIS — H04123 Dry eye syndrome of bilateral lacrimal glands: Secondary | ICD-10-CM | POA: Diagnosis not present

## 2024-01-09 DIAGNOSIS — Z1231 Encounter for screening mammogram for malignant neoplasm of breast: Secondary | ICD-10-CM | POA: Diagnosis not present

## 2024-01-09 DIAGNOSIS — F3281 Premenstrual dysphoric disorder: Secondary | ICD-10-CM | POA: Diagnosis not present

## 2024-01-09 DIAGNOSIS — Z01419 Encounter for gynecological examination (general) (routine) without abnormal findings: Secondary | ICD-10-CM | POA: Diagnosis not present

## 2024-01-09 DIAGNOSIS — Z1331 Encounter for screening for depression: Secondary | ICD-10-CM | POA: Diagnosis not present

## 2024-01-09 DIAGNOSIS — Z01411 Encounter for gynecological examination (general) (routine) with abnormal findings: Secondary | ICD-10-CM | POA: Diagnosis not present

## 2024-01-09 DIAGNOSIS — Z124 Encounter for screening for malignant neoplasm of cervix: Secondary | ICD-10-CM | POA: Diagnosis not present

## 2024-01-30 DIAGNOSIS — J069 Acute upper respiratory infection, unspecified: Secondary | ICD-10-CM | POA: Diagnosis not present

## 2024-02-27 DIAGNOSIS — N898 Other specified noninflammatory disorders of vagina: Secondary | ICD-10-CM | POA: Diagnosis not present

## 2024-02-27 DIAGNOSIS — F3281 Premenstrual dysphoric disorder: Secondary | ICD-10-CM | POA: Diagnosis not present

## 2024-03-20 DIAGNOSIS — Z1331 Encounter for screening for depression: Secondary | ICD-10-CM | POA: Diagnosis not present

## 2024-03-20 DIAGNOSIS — Z23 Encounter for immunization: Secondary | ICD-10-CM | POA: Diagnosis not present

## 2024-03-20 DIAGNOSIS — Z0000 Encounter for general adult medical examination without abnormal findings: Secondary | ICD-10-CM | POA: Diagnosis not present

## 2024-03-20 DIAGNOSIS — Z1339 Encounter for screening examination for other mental health and behavioral disorders: Secondary | ICD-10-CM | POA: Diagnosis not present

## 2024-03-20 DIAGNOSIS — N951 Menopausal and female climacteric states: Secondary | ICD-10-CM | POA: Diagnosis not present

## 2024-03-22 DIAGNOSIS — L2989 Other pruritus: Secondary | ICD-10-CM | POA: Diagnosis not present

## 2024-03-22 DIAGNOSIS — L814 Other melanin hyperpigmentation: Secondary | ICD-10-CM | POA: Diagnosis not present

## 2024-03-22 DIAGNOSIS — L538 Other specified erythematous conditions: Secondary | ICD-10-CM | POA: Diagnosis not present

## 2024-03-22 DIAGNOSIS — L821 Other seborrheic keratosis: Secondary | ICD-10-CM | POA: Diagnosis not present

## 2024-03-22 DIAGNOSIS — C4441 Basal cell carcinoma of skin of scalp and neck: Secondary | ICD-10-CM | POA: Diagnosis not present

## 2024-03-22 DIAGNOSIS — D1801 Hemangioma of skin and subcutaneous tissue: Secondary | ICD-10-CM | POA: Diagnosis not present

## 2024-03-22 DIAGNOSIS — L910 Hypertrophic scar: Secondary | ICD-10-CM | POA: Diagnosis not present
